# Patient Record
Sex: Male | Born: 1965 | Race: White | Hispanic: No | Marital: Married | Smoking: Never smoker
Health system: Southern US, Community
[De-identification: ages and names within clinical notes are randomized; demographics above are authoritative.]

## PROBLEM LIST (undated history)

## (undated) DIAGNOSIS — F419 Anxiety disorder, unspecified: Secondary | ICD-10-CM

## (undated) DIAGNOSIS — K219 Gastro-esophageal reflux disease without esophagitis: Secondary | ICD-10-CM

## (undated) DIAGNOSIS — I10 Essential (primary) hypertension: Secondary | ICD-10-CM

## (undated) DIAGNOSIS — F909 Attention-deficit hyperactivity disorder, unspecified type: Secondary | ICD-10-CM

## (undated) DIAGNOSIS — G4700 Insomnia, unspecified: Secondary | ICD-10-CM

## (undated) DIAGNOSIS — K5792 Diverticulitis of intestine, part unspecified, without perforation or abscess without bleeding: Secondary | ICD-10-CM

## (undated) HISTORY — PX: KNEE SURGERY: SHX244

## (undated) HISTORY — DX: Anxiety disorder, unspecified: F41.9

## (undated) HISTORY — DX: Essential (primary) hypertension: I10

## (undated) HISTORY — DX: Insomnia, unspecified: G47.00

## (undated) HISTORY — DX: Attention-deficit hyperactivity disorder, unspecified type: F90.9

---

## 2000-04-14 ENCOUNTER — Other Ambulatory Visit: Admission: RE | Admit: 2000-04-14 | Discharge: 2000-04-14 | Payer: Self-pay | Admitting: Urology

## 2000-04-14 ENCOUNTER — Encounter (INDEPENDENT_AMBULATORY_CARE_PROVIDER_SITE_OTHER): Payer: Self-pay | Admitting: Specialist

## 2005-01-20 ENCOUNTER — Ambulatory Visit (HOSPITAL_COMMUNITY): Admission: RE | Admit: 2005-01-20 | Discharge: 2005-01-20 | Payer: Self-pay | Admitting: Internal Medicine

## 2007-06-06 ENCOUNTER — Encounter: Admission: RE | Admit: 2007-06-06 | Discharge: 2007-06-06 | Payer: Self-pay | Admitting: Internal Medicine

## 2008-11-13 ENCOUNTER — Ambulatory Visit (HOSPITAL_COMMUNITY): Admission: RE | Admit: 2008-11-13 | Discharge: 2008-11-13 | Payer: Self-pay | Admitting: Internal Medicine

## 2012-08-23 ENCOUNTER — Emergency Department (HOSPITAL_COMMUNITY)
Admission: EM | Admit: 2012-08-23 | Discharge: 2012-08-23 | Disposition: A | Discharge status: Home / Self Care | Payer: 59 | Attending: Emergency Medicine | Admitting: Emergency Medicine

## 2012-08-23 ENCOUNTER — Emergency Department (HOSPITAL_COMMUNITY): Payer: 59

## 2012-08-23 ENCOUNTER — Encounter (HOSPITAL_COMMUNITY): Payer: Self-pay

## 2012-08-23 DIAGNOSIS — R109 Unspecified abdominal pain: Secondary | ICD-10-CM | POA: Insufficient documentation

## 2012-08-23 DIAGNOSIS — Z8719 Personal history of other diseases of the digestive system: Secondary | ICD-10-CM | POA: Insufficient documentation

## 2012-08-23 DIAGNOSIS — R112 Nausea with vomiting, unspecified: Secondary | ICD-10-CM | POA: Insufficient documentation

## 2012-08-23 HISTORY — DX: Diverticulitis of intestine, part unspecified, without perforation or abscess without bleeding: K57.92

## 2012-08-23 LAB — BASIC METABOLIC PANEL
CO2: 26 mEq/L (ref 19–32)
Calcium: 9.6 mg/dL (ref 8.4–10.5)
Chloride: 102 mEq/L (ref 96–112)
GFR calc Af Amer: >90 mL/min (ref >90)
GFR calc non Af Amer: 84 mL/min — ABNORMAL LOW (ref >90)
Glucose, Bld: 76 mg/dL (ref 70–99)
Potassium: 3.7 mEq/L (ref 3.5–5.1)
Sodium: 138 mEq/L (ref 135–145)

## 2012-08-23 LAB — URINALYSIS, ROUTINE W REFLEX MICROSCOPIC
Glucose, UA: NEGATIVE mg/dL
Hgb urine dipstick: NEGATIVE
Ketones, ur: NEGATIVE mg/dL
Leukocytes, UA: NEGATIVE
Nitrite: NEGATIVE
Protein, ur: NEGATIVE mg/dL
Specific Gravity, Urine: 1.01 (ref 1.005–1.030)
Urobilinogen, UA: 0.2 mg/dL (ref 0.0–1.0)
pH: 6.5 (ref 5.0–8.0)

## 2012-08-23 LAB — CBC WITH DIFFERENTIAL/PLATELET
Basophils Absolute: 0 10*3/uL (ref 0.0–0.1)
Basophils Relative: 0 % (ref 0–1)
Eosinophils Absolute: 0.1 10*3/uL (ref 0.0–0.7)
Eosinophils Relative: 1 % (ref 0–5)
HCT: 44.7 % (ref 39.0–52.0)
Lymphocytes Relative: 17 % (ref 12–46)
MCH: 30.5 pg (ref 26.0–34.0)
MCHC: 34.5 g/dL (ref 30.0–36.0)
Monocytes Absolute: 0.4 10*3/uL (ref 0.1–1.0)
Monocytes Relative: 7 % (ref 3–12)
Neutrophils Relative %: 75 % (ref 43–77)
Platelets: 247 10*3/uL (ref 150–400)
RBC: 5.05 MIL/uL (ref 4.22–5.81)
RDW: 12.1 % (ref 11.5–15.5)

## 2012-08-23 MED ORDER — IOHEXOL 300 MG/ML  SOLN
20.0000 mL | INTRAMUSCULAR | Status: AC
  Administered 2012-08-23: 25 mL via ORAL

## 2012-08-23 MED ORDER — IOHEXOL 300 MG/ML  SOLN
100.0000 mL | Freq: Once | INTRAMUSCULAR | Status: AC | PRN
  Administered 2012-08-23: 100 mL via INTRAVENOUS

## 2012-08-23 MED ORDER — OXYCODONE-ACETAMINOPHEN 5-325 MG PO TABS: 1.0000 | ORAL_TABLET | ORAL | Status: DC | PRN

## 2012-08-23 MED ORDER — METOCLOPRAMIDE HCL 10 MG PO TABS: 10.0000 mg | ORAL_TABLET | Freq: Four times a day (QID) | ORAL | Status: DC | PRN

## 2012-08-23 MED ORDER — HYDROMORPHONE HCL PF 1 MG/ML IJ SOLN
1.0000 mg | Freq: Once | INTRAMUSCULAR | Status: AC
  Administered 2012-08-23: 1 mg via INTRAVENOUS
  Filled 2012-08-23: qty 1

## 2012-08-23 MED ORDER — ONDANSETRON HCL 4 MG/2ML IJ SOLN
4.0000 mg | Freq: Once | INTRAMUSCULAR | Status: AC
  Administered 2012-08-23: 4 mg via INTRAVENOUS
  Filled 2012-08-23: qty 2

## 2012-08-23 MED ORDER — SODIUM CHLORIDE 0.9 % IV SOLN
Freq: Once | INTRAVENOUS | Status: AC
  Administered 2012-08-23: 04:00:00 via INTRAVENOUS

## 2012-08-23 NOTE — ED Notes (Signed)
PT. C/o nausea starting Thursday. States over the weekend felt sick to stomach and couldn't get out of bed. Went to [facility] who gave him medicine and scheduled for CT this AM at 1030. States has not had a BM since Sunday. Hx of diverticulitis.

## 2012-08-23 NOTE — ED Provider Notes (Signed)
History     CSN: [id number]  Arrival date & time 08/23/12  0302   First MD Initiated Contact with Patient 08/23/12 0314      Chief Complaint  Patient presents with  . Abdominal Pain    (Consider location/radiation/quality/duration/timing/severity/associated sxs/prior treatment) Patient is a 47 y.o. male presenting with abdominal pain. The history is provided by the patient.  Abdominal Pain Associated symptoms include abdominal pain.  He has been having abdominal pain for the last week. Pain started in the left lower quadrant and now has spread to the left upper quadrant and epigastric area. Pain waxes and wanes but it has not crampy. He has difficulty describing the pain. He states that it is sometimes sharp and sometimes still. He rates pain at 8/10. There is radiation of pain to the back. He has had nausea and vomiting. There's been no hematemesis. He has had fevers as high as 101. He denies chills or sweats. He has not had any diarrhea and states he has not had a bowel movement for the last several days. He does have a history of diverticulitis 8 days ago. He was seen at an urgent care center 4 days ago and started on ciprofloxacin and metronidazole but has not shown any improvement. He was out so given a prescription for ondansetron but he is still having nausea and vomiting in spite of that. He was scheduled for a CT scan later today, but was hurting too much to wait until then.  Past Medical History  Diagnosis Date  . Diverticulitis     Past Surgical History  Procedure Laterality Date  . Knee surgery Left     No family history on file.  History  Substance Use Topics  . Smoking status: Never Smoker   . Smokeless tobacco: Current User  . Alcohol Use: No      Review of Systems  Gastrointestinal: Positive for abdominal pain.  All other systems reviewed and are negative.    Allergies  Review of patient's allergies indicates no known allergies.  Home Medications   No current outpatient prescriptions on file.  BP 135/85  Pulse 77  Resp 18  SpO2 98%  Physical Exam  Nursing note and vitals reviewed.  47 year old male, resting comfortably and in no acute distress. Vital signs are normal. Oxygen saturation is 98%, which is normal. Head is normocephalic and atraumatic. PERRLA, EOMI. Oropharynx is clear. Neck is nontender and supple without adenopathy or JVD. Back is nontender and there is no CVA tenderness. Lungs are clear without rales, wheezes, or rhonchi. Chest is nontender. Heart has regular rate and rhythm without murmur. Abdomen is soft, flat, with mild tenderness in the left lower quadrant. There is no rebound or guarding. There are no masses or hepatosplenomegaly and peristalsis is normoactive. Extremities have no cyanosis or edema, full range of motion is present. Skin is warm and dry without rash. Neurologic: Mental status is normal, cranial nerves are intact, there are no motor or sensory deficits.   ED Course  Procedures (including critical care time)  Labs Reviewed - No data to display No results found.   1. Abdominal pain   2. Nausea & vomiting       MDM  Abdominal pain consistent with diverticulitis. However, failure to respond to appropriate medication as an outpatient is a concern. You'll be sent for CT scan of abdomen and pelvis. Old records are reviewed and he had a CT scan in 2006 which showed sigmoid diverticulitis.  CT  has come back showing no evidence of diverticulitis. Laboratory workup is unremarkable including a normal urinalysis. Liquids stool is noted in the colon consistent with gastroenteritis. Patient is advised of these findings. He is currently on antibiotics for presumed diverticulitis and he is advised that it should be safe to stop his antibiotics. Is adv ised to return should his symptoms worsen. He is discharged home with prescription for Percocet and for metoclopramide.      Dione Booze,  MD 08/23/12 [phone]

## 2012-08-23 NOTE — ED Notes (Addendum)
The patient is AOx4 and comfortable with the discharge instructions.  The patient's ride home is present.

## 2012-08-23 NOTE — ED Notes (Signed)
Advised the patient we need a urine sample.

## 2016-02-12 ENCOUNTER — Ambulatory Visit (HOSPITAL_COMMUNITY)
Admission: EM | Admit: 2016-02-12 | Discharge: 2016-02-12 | Disposition: A | Discharge status: Home / Self Care | Payer: 59 | Attending: Family Medicine | Admitting: Family Medicine

## 2016-02-12 ENCOUNTER — Encounter (HOSPITAL_COMMUNITY): Payer: Self-pay | Admitting: *Deleted

## 2016-02-12 DIAGNOSIS — M25561 Pain in right knee: Secondary | ICD-10-CM | POA: Diagnosis not present

## 2016-02-12 MED ORDER — METHYLPREDNISOLONE ACETATE 80 MG/ML IJ SUSP
INTRAMUSCULAR | Status: AC
  Filled 2016-02-12: qty 1

## 2016-02-12 MED ORDER — BUPIVACAINE HCL (PF) 0.5 % IJ SOLN
INTRAMUSCULAR | Status: AC
  Filled 2016-02-12: qty 10

## 2016-02-12 MED ORDER — LIDOCAINE HCL 2 % IJ SOLN
INTRAMUSCULAR | Status: AC
  Filled 2016-02-12: qty 20

## 2016-02-12 NOTE — ED Provider Notes (Signed)
CSN: [id number]     Arrival date & time 02/12/16  1558 History   First MD Initiated Contact with Patient 02/12/16 1621     Chief Complaint  Patient presents with  . Knee Pain   (Consider location/radiation/quality/duration/timing/severity/associated sxs/prior Treatment) Patient has been having right knee pain for last few days and is requesting cortisone shot.   The history is provided by the patient.  Knee Pain  Location:  Knee Time since incident:  2 days Knee location:  R knee Pain details:    Quality:  Aching   Radiates to:  Does not radiate   Severity:  Moderate   Onset quality:  Sudden   Duration:  2 days   Timing:  Constant   Progression:  Waxing and waning Chronicity:  New Dislocation: no   Foreign body present:  No foreign bodies Tetanus status:  Unknown Prior injury to area:  No Relieved by:  Nothing Worsened by:  Activity Ineffective treatments:  NSAIDs Associated symptoms: stiffness     Past Medical History:  Diagnosis Date  . Diverticulitis    Past Surgical History:  Procedure Laterality Date  . KNEE SURGERY Left    History reviewed. No pertinent family history. Social History  Substance Use Topics  . Smoking status: Never Smoker  . Smokeless tobacco: Current User  . Alcohol use No    Review of Systems  Constitutional: Negative.   HENT: Negative.   Eyes: Negative.   Respiratory: Negative.   Cardiovascular: Negative.   Gastrointestinal: Negative.   Endocrine: Negative.   Genitourinary: Negative.   Musculoskeletal: Positive for arthralgias and stiffness.  Skin: Negative.   Allergic/Immunologic: Negative.   Neurological: Negative.   Hematological: Negative.   Psychiatric/Behavioral: Negative.     Allergies  Patient has no known allergies.  Home Medications   Prior to Admission medications   Medication Sig Start Date End Date Taking? Authorizing Provider  amphetamine-dextroamphetamine (ADDERALL XR) 20 MG 24 hr capsule Take 20 mg by  mouth every morning.    Historical Provider, MD  ciprofloxacin (CIPRO) 500 MG tablet Take 500 mg by mouth 2 (two) times daily.    Historical Provider, MD  metoCLOPramide (REGLAN) 10 MG tablet Take 1 tablet (10 mg total) by mouth every 6 (six) hours as needed (Nausea). 08/23/12   Dione Boozeavid Glick, MD  metroNIDAZOLE (FLAGYL) 500 MG tablet Take 500 mg by mouth 3 (three) times daily.    Historical Provider, MD  ondansetron (ZOFRAN-ODT) 4 MG disintegrating tablet Take 4 mg by mouth every 8 (eight) hours as needed for nausea.    Historical Provider, MD  oxyCODONE-acetaminophen (PERCOCET/ROXICET) 5-325 MG per tablet Take 1 tablet by mouth every 4 (four) hours as needed for pain. 08/23/12   Dione Boozeavid Glick, MD  triazolam (HALCION) 0.25 MG tablet Take 0.25 mg by mouth at bedtime as needed (sleep).    Historical Provider, MD   Meds Ordered and Administered this Visit  Medications - No data to display  BP (!) 153/105 (BP Location: Right Arm)   Pulse 82   Temp 98.6 F (37 C) (Oral)   Resp 16   SpO2 99%  No data found.   Physical Exam  Urgent Care Course   Clinical Course     .Joint Aspiration/Arthrocentesis Date/Time: 02/12/2016 4:38 PM Performed by: Deatra CanterXFORD, WILLIAM J Authorized by: Bradd CanaryKINDL, JAMES D   Consent:    Consent obtained:  Verbal   Risks discussed:  Bleeding   Alternatives discussed:  No treatment Location:    Location:  Knee Anesthesia (see MAR for exact dosages):    Anesthesia method:  Local infiltration   Local anesthetic:  Lidocaine 2% w/o epi and bupivacaine 0.25% w/o epi Procedure details:    Needle gauge:  22 G   Ultrasound guidance: no     Approach:  Medial   Aspirate amount:  0   Steroid injected: yes     Specimen collected: no   Post-procedure details:    Dressing:  Adhesive bandage   Patient tolerance of procedure:  Tolerated well, no immediate complications Comments:     80mg  depomedrol  2 cc's bupivicaine 2 cc's of 2% lidocaine Injected into right knee.    (including critical care time)  Labs Review Labs Reviewed - No data to display  Imaging Review No results found.   Visual Acuity Review  Right Eye Distance:   Left Eye Distance:   Bilateral Distance:    Right Eye Near:   Left Eye Near:    Bilateral Near:         MDM   1. Acute pain of right knee    Right knee injection with 80mg  kenalog 2 cc's bupivicaine and 2 cc's of 2% lidocaine     Deatra CanterWilliam J Oxford, FNP 02/12/16 1641

## 2016-02-12 NOTE — ED Triage Notes (Signed)
R  Knee  Pain      Started  Last  Pm       No   specefic  Injury     Pt  Requesting  A  Cortisone  Shot        Problems  With  l knee  In  Past

## 2018-11-07 ENCOUNTER — Ambulatory Visit: Payer: Self-pay

## 2018-11-23 ENCOUNTER — Other Ambulatory Visit: Payer: Self-pay

## 2018-11-23 NOTE — Progress Notes (Signed)
Pre-employment urine drug screen collected using [employer] Chain of Custody forms for [place] Account # [id number]. Specimen ID # [id number]

## 2018-12-04 ENCOUNTER — Telehealth: Payer: Self-pay | Admitting: Internal Medicine

## 2018-12-04 NOTE — Telephone Encounter (Signed)
No--thanks--but I needed info for drug screen.

## 2018-12-04 NOTE — Telephone Encounter (Signed)
Please review. Is he your patient?

## 2018-12-04 NOTE — Telephone Encounter (Signed)
Pt calling to let Dr. Rosanna Randy know the medications he is currently taking.  amphetamine-dextroamphetamine (ADDERALL XR) 20 MG 24 hr capsule triazolam (HALCION) 0.25 MG tablet Irbesartan 150 mg  Pt uses:  [facility]  Address: [address] [place], [place] [address] Phone: [phone]  Thanks, [facility]

## 2018-12-14 ENCOUNTER — Ambulatory Visit: Payer: Self-pay

## 2018-12-14 DIAGNOSIS — Z23 Encounter for immunization: Secondary | ICD-10-CM

## 2019-01-23 ENCOUNTER — Other Ambulatory Visit: Payer: Self-pay

## 2019-01-23 DIAGNOSIS — Z20822 Contact with and (suspected) exposure to covid-19: Secondary | ICD-10-CM

## 2019-01-25 LAB — NOVEL CORONAVIRUS, NAA: SARS-CoV-2, NAA: NOT DETECTED

## 2019-03-22 DIAGNOSIS — E291 Testicular hypofunction: Secondary | ICD-10-CM | POA: Diagnosis not present

## 2019-03-22 DIAGNOSIS — R69 Illness, unspecified: Secondary | ICD-10-CM | POA: Diagnosis not present

## 2019-03-22 DIAGNOSIS — I10 Essential (primary) hypertension: Secondary | ICD-10-CM | POA: Diagnosis not present

## 2019-05-16 ENCOUNTER — Ambulatory Visit: Payer: 59 | Attending: Family

## 2019-05-16 DIAGNOSIS — Z23 Encounter for immunization: Secondary | ICD-10-CM

## 2019-05-16 NOTE — Progress Notes (Signed)
   Covid-19 Vaccination Clinic  Name:  Logan Bruce    MRN: [id number] DOB: [birth year]  05/16/2019  Mr. Bee was observed post Covid-19 immunization for 15 minutes without incident. He was provided with Vaccine Information Sheet and instruction to access the V-Safe system.   [name] was instructed to call 911 with any severe reactions post vaccine: Marland Kitchen Difficulty breathing  . Swelling of face and throat  . A fast heartbeat  . A bad rash all over body  . Dizziness and weakness   Immunizations Administered    Name Date Dose VIS Date Route   Moderna COVID-19 Vaccine 05/16/2019 10:44 AM 0.5 mL 02/12/2019 Intramuscular   Manufacturer: Moderna   Lot: [id number]   NDC: 38177-116-57

## 2019-05-28 ENCOUNTER — Other Ambulatory Visit: Payer: Self-pay

## 2019-05-28 ENCOUNTER — Ambulatory Visit: Payer: Self-pay | Admitting: Occupational Medicine

## 2019-05-28 VITALS — BP 143/106 | HR 84 | Temp 98.5°F | Wt 161.8 lb

## 2019-05-28 DIAGNOSIS — M79645 Pain in left finger(s): Secondary | ICD-10-CM

## 2019-05-28 MED ORDER — TRAMADOL HCL 50 MG PO TABS: 50.0000 mg | ORAL_TABLET | Freq: Three times a day (TID) | ORAL | 0 refills | Status: AC | PRN

## 2019-05-28 NOTE — Progress Notes (Signed)
   Subjective:    Patient ID: Logan Bruce, male    DOB: 1965-10-19, 54 y.o.   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  He presents to our onsite employee health clinic with a complaint of left thumb pain for the last month.  There was no injury accident or trauma history.  He has been noting discomfort in his left MCP joint for about a month now.  He notes swelling in same area.  Worse with gripping.  No reports of skin redness.  No prior history of gout.  No other joints affected.  He has been taking ibuprofen with some relief.  Last night he said it was so intense that he could not sleep until 3:00 in the morning.  Pain finally subsided.  No inciting trauma or event.    Review of Systems     Objective:   Physical Exam  Pleasant gentleman in no distress.  Left thumb full range of motion.  Swelling and tenderness noted at the left thumb MCP joint.  No redness.  No rash.  No open skin.  I could not appreciate any palpable masses over the dorsal thumb.      Assessment & Plan:  Subacute left thumb pain-with the chronicity of this problem I think it is wise to send him over to orthopedics.  In the meantime I think that some tramadol at bedtime would be helpful for pain management.

## 2019-06-07 NOTE — Progress Notes (Signed)
Patient ID: Logan Bruce, male    DOB: 1965/09/04, 54 y.o.   MRN: [id number]  PCP: Jamelle Haring, MD  Chief Complaint  Patient presents with  . New Patient (Initial Visit)  . Establish Care  . Hypertension    Subjective:   Logan Bruce is a 54 y.o. male, presents to clinic with CC of the following:  Chief Complaint  Patient presents with  . New Patient (Initial Visit)  . Establish Care  . Hypertension    HPI:  Patient is a 54 year old male who works for the city of [place] who presents today new to the practice. He just had a complete physical with his doctor who is retiring, and Dr. Chilton Si in January.  All of his records are on paper charts, and he had that paper chart with him today to review.  HTN hx Reviewed medication regimen. Is taking BP med, irbesartan 150 mg daily.  He was told to increase the 300 mg by his doctor when he last saw in January due to concerns for his blood pressure, although he has not yet done that, as he was concerned he may run out of his medicine.  Has checked BP's at home and running in the 160/110 range on home checks. Weight has remained stable, may be increased some in recent past  No CP, SOB, marked fatigue, increased HA's, vision changes, LE swelling   Diet  - trying to eat healthy (DASH diet, low sodium intake discussed) to help keep weight controlled, a lot of salads Physical activity level - no reg exercise, walks during day  ADHD  Noted he has been on a stimulant medicine like Adderall since almost elementary school.  It helps him stay focused, especially with a job like he has presently.  He continues to take it daily.  Denies any side effects from this medicine.  No palpitations, chest pains, shortness of breath, heart racing.  He also notes that his wife is on Vyvanse, and his kids are on stimulant medications. He had evaluations when he was really young in school, although has had no real evaluations done more  recently.  He does feel he needs this for his occupation. He has been off of this now for the past 2 to 3 weeks  GERD; He takes omeprazole daily, and states he has to to be able to eat spaghetti, pizza, red sauces.  He has been on this for years.  It is another medicine he states he really cannot do without.  Insomnia: He has been taking triazolam at bedtime to help him sleep for years.  Recently, that was increased to 1-1/2 tablets from 1.  He states he has tried multiple other entities over time and spent a lot of money and co-pays for medicines that just made him groggy during the day, did not work, and this 1 works great for him.  He states if he does not have it, he cannot sleep for more than 15 minutes, and that he just simply cannot work.  Thumb Pain - He was last seen at the [facility] [place] clinic in early March with left thumb pain, and referred to orthopedics.  He noted he was coming here, and did not go to orthopedics.  He feels pain at the base of his left thumb, more in the need of the muscle area, and also behind the dorsal surface of the left thumb towards the wrist joint.  He notes he works on  his laptop in his car, and often rests his hand right on the area that is uncomfortable.  He denied any trauma before this came on.  It has been about a month now, and is a little better now than it was when he saw the clinician in the city clinic.  He was given tramadol to take for pain, mostly at nighttime, and has not used it that much recent past.  He is right-handed.  No numbness or tingling in the thumb.  His past medical history is significant for diverticulitis, Also had left knee surgery, And more recently had a right knee injection at an urgent care in December 2017.  He noted he was told he likely needed a tetanus shot, although looking through the chart he brought, his last tetanus was in 2015.  (Tdap)  He was also recommended to get a colonoscopy by his previous doctor, and was  inquiring about that.    Current Outpatient Medications:  .  amphetamine-dextroamphetamine (ADDERALL XR) 20 MG 24 hr capsule, Take 20 mg by mouth every morning., Disp: , Rfl:  .  irbesartan (AVAPRO) 300 MG tablet, Take 300 mg by mouth daily., Disp: , Rfl:  .  omeprazole (PRILOSEC) 20 MG capsule, Take 20 mg by mouth daily., Disp: , Rfl:  .  traMADol (ULTRAM) 50 MG tablet, Take by mouth every 6 (six) hours as needed., Disp: , Rfl:  .  triazolam (HALCION) 0.25 MG tablet, Take 0.25 mg by mouth at bedtime as needed (sleep)., Disp: , Rfl:    No Known Allergies   Past Surgical History:  Procedure Laterality Date  . KNEE SURGERY Left    x3 reconstruction-[facility]     Family History  Problem Relation Age of Onset  . Cancer Father      Social History   Tobacco Use  . Smoking status: Never Smoker  . Smokeless tobacco: Current User  Substance Use Topics  . Alcohol use: No    With staff assistance, above reviewed with the patient today.  ROS: As per HPI, otherwise no specific complaints on a limited and focused system review   No results found for this or any previous visit (from the past 72 hour(s)).   PHQ2/9: Depression screen [facility] 2/9 06/10/2019  Decreased Interest 0  Down, Depressed, Hopeless 0  PHQ - 2 Score 0  Altered sleeping 0  Tired, decreased energy 0  Change in appetite 0  Feeling bad or failure about yourself  0  Trouble concentrating 0  Moving slowly or fidgety/restless 0  Suicidal thoughts 0  PHQ-9 Score 0  Difficult doing work/chores Not difficult at all   PHQ-2/9 Result is neg Fall Risk: Fall Risk  06/10/2019  Falls in the past year? 0  Number falls in past yr: 0  Injury with Fall? 0      Objective:   Vitals:   06/10/19 1357  BP: (!) 140/100  Pulse: 82  Resp: 16  Temp: 97.7 F (36.5 C)  TempSrc: Temporal  SpO2: 98%  Weight: 158 lb 14.4 oz (72.1 kg)  Height: 5\' 7"  (1.702 m)    Body mass index is 24.89  kg/m.  Physical Exam   NAD, masked HEENT - [place]/AT, sclera anicteric, PERRL, EOMI, conj - non-inj'ed, TM's and canals clear, pharynx clear Neck - supple, no adenopathy, no TM, carotids 2+ and = without bruits bilat Car - RRR without m/g/r Pulm- RR and effort normal at rest, CTA without wheeze or rales Abd -  soft, NT diffusely, no focal epigastric discomfort  Back - no CVA tenderness Skin- no rash noted on exposed areas Ext - no LE edema,  L hand -he had good range of motion of his left thumb, no marked pain with motion testing, good pinch strength pinching his thumb to his fifth digit resisting a pull-through, not markedly tender to palpate at the base of the thumb over the meet of the muscle region nor on the dorsal aspect along the metacarpal region and proximal, where he feels his discomfort at times, no focal tenderness at the snuffbox, no marked wrist discomfort with palpation, no swelling, Finkelstein's test was mildly positive, sensation intact to light touch over the hand and thumb area. Neuro/psychiatric - affect was not flat, appropriate with conversation  Alert and oriented  Grossly non-focal - good strength on testing extremities, sensation intact to LT in distal extremities, Romberg was negative, no pronator drift, good finger-to-nose, normal gait with good tandem walk, DTRs 2+ on the right patella, 1+ left patella (where he has had prior surgeries, and noted that was not a new finding)  Speech  normal   Results for orders placed or performed in visit on 01/23/19  Novel Coronavirus, NAA ([facility])   Specimen: Nasopharyngeal(NP) swabs in vial transport medium   NASOPHARYNGE  TESTING  Result Value Ref Range   SARS-CoV-2, NAA Not Detected Not Detected   Labs 03/25/19 - TC - 217, LDL - 121, HDL 71 Glc - 101, Comp panel normal otherwise, CBC - normal PSA - 3.1 Hep C ab - NR      Assessment & Plan:   1. Essential hypertension We will increase the irbesartan to 300 mg daily  from 150, and noted to him I was not too optimistic that that will be enough to get his blood pressure at goal, although he was hesitant to try to add a second medicine at this point, and will try the increased dose briefly.  He is to check his blood pressures at home after taking the increased dose for 4 to 5 days, and write them down and bring with with him on follow-up Likely will need a second medicine for better control although await his response. Echo at length my concerns with him being on Adderall with his blood pressure this high, although he was convinced that the Adderall was in no way causative of his blood pressure elevation, and is a medicine he needs for his ability to work. - irbesartan (AVAPRO) 300 MG tablet; Take 1 tablet (300 mg total) by mouth daily.  Dispense: 90 tablet; Refill: 1  2. Attention deficit hyperactivity disorder (ADHD), unspecified ADHD type As above, concerned with the stimulant medicine, and all of the years he has been taking 1.  Noted this to him today.  Noted it is a very controlled substance, and when I can put a refill on.  He will follow-up before he is in need of another refill, and do want to get his blood pressure better controlled. - amphetamine-dextroamphetamine (ADDERALL XR) 20 MG 24 hr capsule; Take 1 capsule (20 mg total) by mouth every morning.  Dispense: 30 capsule; Refill: 0  3. Other insomnia Discussed at length my concerns with routine use of triazolam, a benzodiazepine for sleep.  He has been on this for years, and very adamant that he has tried a lot of other entities which have not been helpful.  He is in no way wants to try any other entities at this point, and states  he just simply cannot do without this especially with respect to his job.  At this point, felt the best approach was to refill it presently.  Did discuss my concerns with this medicine including tachyphylaxis and dependence issues. - triazolam (HALCION) 0.25 MG tablet; Take 1 tablet  (0.25 mg total) by mouth at bedtime as needed (sleep; Take 1.5 by mouth daily).  Dispense: 45 tablet; Refill: 5  4. Gastroesophageal reflux disease without esophagitis Discussed trying days away from this medicine, and some of the concerns with being on this long-term better servicing. - omeprazole (PRILOSEC) 20 MG capsule; Take 1 capsule (20 mg total) by mouth daily.  Dispense: 30 capsule; Refill: 5  5. Screening for colorectal cancer Agreed with his previous doctor that a screening colonoscopy would be best right now and ordered.  He then noted late in the visit about Cologuard as an option, and I did note that this was an option, although I still believe the gold standard is the colonoscopy.  If the Cologuard does return positive, he would proceed to a colonoscopy.  Inquired about time frames, and informed him if his colonoscopy was negative, he would not need a repeat for 10 years.  They agree with wanting to proceed with the colonoscopy when discussed these 2 options. - Ambulatory referral to Gastroenterology  6. Tendinitis of thumb Educated, likely feel there may be a compression component with him having poor ergonomics with his laptop use in his car contributing, namely constant compression on the area that is tender of the left base of the hand.  Recommended a pad or avoiding compression is best.  Also noted a possible element of a de Quervain's like tenosynovitis, and recommended ice, especially at the end of the day, trying to avoid any tramadol use at this point, and if more problematic, getting an over-the-counter spica splint to apply to wear for 1 to 2 weeks and assess his response. Discussed how an x-ray is probably not going to be helpful presently, although symptoms are not improving, likely will be ordered.  7. Mixed hyperlipidemia Reviewed his cholesterol results with him, which his previous doctor noted were all good.  Noted the LDL cholesterol was higher than desired, and the  total cholesterol slightly elevated, although that his profile was not that bad. We will repeat this fasting on his follow-up visit to help reassess.  8. History of prediabetes His blood sugar was just slightly over the 100 desired range, and it was not fasting.  We will recheck this on his follow-up visit when he comes fasting.  We will follow-up in about 4 weeks time, sooner as needed,     Jamelle Haring, MD 06/10/19 2:27 PM

## 2019-06-10 ENCOUNTER — Other Ambulatory Visit: Payer: Self-pay

## 2019-06-10 ENCOUNTER — Encounter: Payer: Self-pay | Admitting: Internal Medicine

## 2019-06-10 ENCOUNTER — Ambulatory Visit (INDEPENDENT_AMBULATORY_CARE_PROVIDER_SITE_OTHER): Payer: 59 | Admitting: Internal Medicine

## 2019-06-10 VITALS — BP 140/100 | HR 82 | Temp 97.7°F | Resp 16 | Ht 67.0 in | Wt 158.9 lb

## 2019-06-10 DIAGNOSIS — Z0000 Encounter for general adult medical examination without abnormal findings: Secondary | ICD-10-CM | POA: Insufficient documentation

## 2019-06-10 DIAGNOSIS — F909 Attention-deficit hyperactivity disorder, unspecified type: Secondary | ICD-10-CM

## 2019-06-10 DIAGNOSIS — R69 Illness, unspecified: Secondary | ICD-10-CM | POA: Diagnosis not present

## 2019-06-10 DIAGNOSIS — K219 Gastro-esophageal reflux disease without esophagitis: Secondary | ICD-10-CM

## 2019-06-10 DIAGNOSIS — Z87898 Personal history of other specified conditions: Secondary | ICD-10-CM | POA: Insufficient documentation

## 2019-06-10 DIAGNOSIS — E782 Mixed hyperlipidemia: Secondary | ICD-10-CM | POA: Diagnosis not present

## 2019-06-10 DIAGNOSIS — M778 Other enthesopathies, not elsewhere classified: Secondary | ICD-10-CM | POA: Diagnosis not present

## 2019-06-10 DIAGNOSIS — Z1211 Encounter for screening for malignant neoplasm of colon: Secondary | ICD-10-CM

## 2019-06-10 DIAGNOSIS — I10 Essential (primary) hypertension: Secondary | ICD-10-CM

## 2019-06-10 DIAGNOSIS — Z1212 Encounter for screening for malignant neoplasm of rectum: Secondary | ICD-10-CM | POA: Diagnosis not present

## 2019-06-10 DIAGNOSIS — G4709 Other insomnia: Secondary | ICD-10-CM

## 2019-06-10 MED ORDER — OMEPRAZOLE 20 MG PO CPDR: 20.0000 mg | DELAYED_RELEASE_CAPSULE | Freq: Every day | ORAL | 5 refills | Status: DC

## 2019-06-10 MED ORDER — AMPHETAMINE-DEXTROAMPHET ER 20 MG PO CP24: 20.0000 mg | ORAL_CAPSULE | ORAL | 0 refills | Status: DC

## 2019-06-10 MED ORDER — TRIAZOLAM 0.25 MG PO TABS: 0.2500 mg | ORAL_TABLET | Freq: Every evening | ORAL | 5 refills | Status: DC | PRN

## 2019-06-10 MED ORDER — IRBESARTAN 300 MG PO TABS: 300.0000 mg | ORAL_TABLET | Freq: Every day | ORAL | 1 refills | Status: DC

## 2019-06-10 NOTE — Patient Instructions (Addendum)
Referral for colonoscopy provided.  Follow-up in about 4 weeks, please come fasting for that appointment   Hypertension, Adult Hypertension is another name for high blood pressure. High blood pressure forces your heart to work harder to pump blood. This can cause problems over time. There are two numbers in a blood pressure reading. There is a top number (systolic) over a bottom number (diastolic). It is best to have a blood pressure that is below 120/80. Healthy choices can help lower your blood pressure, or you may need medicine to help lower it. What are the causes? The cause of this condition is not known. Some conditions may be related to high blood pressure. What increases the risk?  Smoking.  Having type 2 diabetes mellitus, high cholesterol, or both.  Not getting enough exercise or physical activity.  Being overweight.  Having too much fat, sugar, calories, or salt (sodium) in your diet.  Drinking too much alcohol.  Having long-term (chronic) kidney disease.  Having a family history of high blood pressure.  Age. Risk increases with age.  Race. You may be at higher risk if you are African American.  Gender. Men are at higher risk than women before age 70. After age 27, women are at higher risk than men.  Having obstructive sleep apnea.  Stress. What are the signs or symptoms?  High blood pressure may not cause symptoms. Very high blood pressure (hypertensive crisis) may cause: ? Headache. ? Feelings of worry or nervousness (anxiety). ? Shortness of breath. ? Nosebleed. ? A feeling of being sick to your stomach (nausea). ? Throwing up (vomiting). ? Changes in how you see. ? Very bad chest pain. ? Seizures. How is this treated?  This condition is treated by making healthy lifestyle changes, such as: ? Eating healthy foods. ? Exercising more. ? Drinking less alcohol.  Your health care provider may prescribe medicine if lifestyle changes are not enough to  get your blood pressure under control, and if: ? Your top number is above 130. ? Your bottom number is above 80.  Your personal target blood pressure may vary. Follow these instructions at home: Eating and drinking   If told, follow the DASH eating plan. To follow this plan: ? Fill one half of your plate at each meal with fruits and vegetables. ? Fill one fourth of your plate at each meal with whole grains. Whole grains include whole-wheat pasta, brown rice, and whole-grain bread. ? Eat or drink low-fat dairy products, such as skim milk or low-fat yogurt. ? Fill one fourth of your plate at each meal with low-fat (lean) proteins. Low-fat proteins include fish, chicken without skin, eggs, beans, and tofu. ? Avoid fatty meat, cured and processed meat, or chicken with skin. ? Avoid pre-made or processed food.  Eat less than 1,500 mg of salt each day.  Do not drink alcohol if: ? Your doctor tells you not to drink. ? You are pregnant, may be pregnant, or are planning to become pregnant.  If you drink alcohol: ? Limit how much you use to:  0-1 drink a day for women.  0-2 drinks a day for men. ? Be aware of how much alcohol is in your drink. In the [place] one drink equals one 12 oz bottle of beer (355 mL), one 5 oz glass of wine (148 mL), or one 1 oz glass of hard liquor (44 mL). Lifestyle   Work with your doctor to stay at a healthy weight or to lose weight. Ask  your doctor what the best weight is for you.  Get at least 30 minutes of exercise most days of the week. This may include walking, swimming, or biking.  Get at least 30 minutes of exercise that strengthens your muscles (resistance exercise) at least 3 days a week. This may include lifting weights or doing Pilates.  Do not use any products that contain nicotine or tobacco, such as cigarettes, e-cigarettes, and chewing tobacco. If you need help quitting, ask your doctor.  Check your blood pressure at home as told by your  doctor.  Keep all follow-up visits as told by your doctor. This is important. Medicines  Take over-the-counter and prescription medicines only as told by your doctor. Follow directions carefully.  Do not skip doses of blood pressure medicine. The medicine does not work as well if you skip doses. Skipping doses also puts you at risk for problems.  Ask your doctor about side effects or reactions to medicines that you should watch for. Contact a doctor if you:  Think you are having a reaction to the medicine you are taking.  Have headaches that keep coming back (recurring).  Feel dizzy.  Have swelling in your ankles.  Have trouble with your vision. Get help right away if you:  Get a very bad headache.  Start to feel mixed up (confused).  Feel weak or numb.  Feel faint.  Have very bad pain in your: ? Chest. ? Belly (abdomen).  Throw up more than once.  Have trouble breathing. Summary  Hypertension is another name for high blood pressure.  High blood pressure forces your heart to work harder to pump blood.  For most people, a normal blood pressure is less than 120/80.  Making healthy choices can help lower blood pressure. If your blood pressure does not get lower with healthy choices, you may need to take medicine. This information is not intended to replace advice given to you by your health care provider. Make sure you discuss any questions you have with your health care provider. Document Revised: 11/08/2017 Document Reviewed: 11/08/2017 Elsevier Patient Education  2020 [employer].

## 2019-06-14 ENCOUNTER — Ambulatory Visit: Payer: Self-pay | Admitting: Internal Medicine

## 2019-06-17 ENCOUNTER — Ambulatory Visit: Payer: Self-pay

## 2019-06-17 ENCOUNTER — Other Ambulatory Visit: Payer: Self-pay

## 2019-06-17 VITALS — BP 147/99 | HR 70 | Resp 12

## 2019-06-17 DIAGNOSIS — Z0130 Encounter for examination of blood pressure without abnormal findings: Secondary | ICD-10-CM

## 2019-06-17 NOTE — Progress Notes (Signed)
Present requesting BP check.  States has had a headache the past 2-3 hours.  PCP is [facility].  States he takes BP medicine & PCP increased the dose last week.  Offered tylenol or ibuprofen for headache - states I'll just go home & take something.  AMD

## 2019-06-18 ENCOUNTER — Ambulatory Visit: Payer: 59 | Attending: Family

## 2019-06-18 DIAGNOSIS — Z23 Encounter for immunization: Secondary | ICD-10-CM

## 2019-06-18 NOTE — Progress Notes (Signed)
   Covid-19 Vaccination Clinic  Name:  JAYLEN KNOPE    MRN: [id number] DOB: 09/18/1965  06/18/2019  Mr. Pelzer was observed post Covid-19 immunization for 15 minutes without incident. He was provided with Vaccine Information Sheet and instruction to access the V-Safe system.   Mr. Shiffler was instructed to call 911 with any severe reactions post vaccine: Marland Kitchen Difficulty breathing  . Swelling of face and throat  . A fast heartbeat  . A bad rash all over body  . Dizziness and weakness   Immunizations Administered    Name Date Dose VIS Date Route   Moderna COVID-19 Vaccine 06/18/2019 12:56 PM 0.5 mL 02/12/2019 Intramuscular   Manufacturer: Moderna   Lot: [id number]   NDC: 74734-037-09

## 2019-06-20 ENCOUNTER — Encounter: Payer: Self-pay | Admitting: *Deleted

## 2019-06-21 NOTE — Telephone Encounter (Signed)
-----   Message from Jamelle Haring, MD sent at 06/20/2019  2:57 PM EDT ----- Regarding: follow up Can we contact him or send a reminder to him. Thanks. ----- Message ----- From: Christell Faith Sent: 06/20/2019  11:13 AM EDT To: Jamelle Haring, MD

## 2019-06-21 NOTE — Telephone Encounter (Signed)
Called and lvm informing patient to contact [facility] to schedule his screening colonoscopy.  I also sent patient a message via mychart.

## 2019-06-24 ENCOUNTER — Telehealth: Payer: Self-pay

## 2019-06-24 NOTE — Telephone Encounter (Signed)
Patient left voicemail. Wants to have colon screening. Referral closed/due to no contact. Please re-open, I don't have security to do so.

## 2019-06-25 NOTE — Telephone Encounter (Signed)
Hey Dejuanna-referral has been opened.  Thanks,  [place], [place]

## 2019-07-02 NOTE — Progress Notes (Signed)
Patient ID: [name], male    DOB: 1965-07-06, 54 y.o.   MRN: [id number]  PCP: Towanda Malkin, MD  Chief Complaint  Patient presents with  . Follow-up  . ADHD  . Gastroesophageal Reflux  . Insomnia  . Hypertension    Subjective:   Logan Bruce is a 54 y.o. male, presents to clinic with CC of the following:  Chief Complaint  Patient presents with  . Follow-up  . ADHD  . Gastroesophageal Reflux  . Insomnia  . Hypertension    HPI:  Patient is a 54 year old male who I first met about a month ago. He had been followed by Dr. Nyoka Cowden prior. We discussed having him return fasting for this visit to check some follow-up lab tests  HTNhx He did present on 06/17/2019 to the [facility] [place] clinic as he was having headaches to get his blood pressure checked.  It was 147/99. Reviewed medication regimen - irbesartan,  was increased to 300 mg daily from 150 mg daily last visit, although I noted that may not be enough to get better control of his blood pressure and discussed the second medicine addition.  He very much wanted to try the increase before adding a second medicine.  I did note on my last note it was likely would need a second medicine for better blood pressure control.  BP Readings from Last 3 Encounters:  07/03/19 (!) 140/98  06/17/19 (!) 147/99  06/10/19 (!) 140/100   Has checked BP's at home and the lower number has been running in the 90-100 range.  It is not sure of what the top number has been running when asked, as he mainly pays attention to the bottom number.  He has been getting about 2-3 blood pressure checks with me, in the p.m. He denies persistent headaches, had one that prompted his visit to the city clinic above for a blood pressure check, and occur maybe once or twice a week at most.  Denies any other concerning symptoms. No CP, SOB, marked fatigue,increased HA's, vision changes, LE swelling  Weight has remained stable Wt Readings  from Last 3 Encounters:  07/03/19 158 lb (71.7 kg)  06/10/19 158 lb 14.4 oz (72.1 kg)  05/28/19 161 lb 12.8 oz (73.4 kg)    ADHD  Noted he has been on a stimulant medicine like Adderall since almost elementary school.  It helps him stay focused, especially with a job like he has presently.  He continues to take it daily.   Denies any side effects from this medicine.  No palpitations, chest pains, shortness of breath, heart racing. I did ask if he ever has days where he does not take it, like on the weekend, and he states that can happen.  Strongly encouraged days away when possible today, as well as use of this medicine will be helpful with respect to his blood pressure control and his cardiac health in general. To review - He had evaluations when he was really young in school, although has had no real evaluations done more recently.  He does feel he needs this for his occupation. I did discuss concerns with the stimulant medicine and the length of time he has been taking it previously.  GERD; He continues to take omeprazole daily, and denied any reflux symptoms recently. He has been on this for years.  It is another medicine he states he really cannot do without.  Insomnia: On triazolam qhs  To review -  He has been taking triazolam at bedtime to help him sleep for years.  Recently, that was increased to 1-1/2 tablets from 1.  He states he has tried multiple other entities over time and spent a lot of money and co-pays for medicines that just made him groggy during the day, did not work, and this 1 works great for him.  He states if he does not have it, he cannot sleep for more than 15 minutes, and that he just simply cannot work.   I discussed at length my concerns with routine use of triazolam previously, a benzodiazepine for sleep.  He has been on this for years, and very adamant that he has tried a lot of other entities which have not been helpful.  He is in no way wants to try any other  entities at this point, and states he just simply cannot do without this especially with respect to his job. Did discuss my concerns with this medicine including tachyphylaxis and dependence issues.   Patient Active Problem List   Diagnosis Date Noted  . Essential hypertension 06/10/2019  . Attention deficit hyperactivity disorder (ADHD) 06/10/2019  . Other insomnia 06/10/2019  . Gastroesophageal reflux disease without esophagitis 06/10/2019  . Tendinitis of thumb 06/10/2019  . Mixed hyperlipidemia 06/10/2019  . History of prediabetes 06/10/2019      Current Outpatient Medications:  .  amphetamine-dextroamphetamine (ADDERALL XR) 20 MG 24 hr capsule, Take 1 capsule (20 mg total) by mouth every morning., Disp: 30 capsule, Rfl: 0 .  irbesartan (AVAPRO) 300 MG tablet, Take 1 tablet (300 mg total) by mouth daily., Disp: 90 tablet, Rfl: 1 .  omeprazole (PRILOSEC) 20 MG capsule, Take 1 capsule (20 mg total) by mouth daily., Disp: 30 capsule, Rfl: 5 .  triazolam (HALCION) 0.25 MG tablet, Take 1 tablet (0.25 mg total) by mouth at bedtime as needed (sleep; Take 1.5 by mouth daily)., Disp: 45 tablet, Rfl: 5   No Known Allergies   Past Surgical History:  Procedure Laterality Date  . KNEE SURGERY Left    x3 reconstruction-[facility]     Family History  Problem Relation Age of Onset  . Cancer Father      Social History   Tobacco Use  . Smoking status: Never Smoker  . Smokeless tobacco: Current User  Substance Use Topics  . Alcohol use: No    With staff assistance, above reviewed with the patient today.  ROS: As per HPI, otherwise no specific complaints on a limited and focused system review   No results found for this or any previous visit (from the past 72 hour(s)).   PHQ2/9: Depression screen [facility] 2/9 07/03/2019 06/10/2019  Decreased Interest 0 0  Down, Depressed, Hopeless 0 0  PHQ - 2 Score 0 0  Altered sleeping 0 0  Tired, decreased energy 0 0    Change in appetite 0 0  Feeling bad or failure about yourself  0 0  Trouble concentrating 0 0  Moving slowly or fidgety/restless 0 0  Suicidal thoughts 0 0  PHQ-9 Score 0 0  Difficult doing work/chores Not difficult at all Not difficult at all   PHQ-2/9 Result is neg  Fall Risk: Fall Risk  07/03/2019 06/10/2019  Falls in the past year? 0 0  Number falls in past yr: 0 0  Injury with Fall? 0 0      Objective:   Vitals:   07/03/19 1106  BP: (!) 140/98  Pulse: 76  Resp: 16  Temp: (!) 97.5 F (36.4 C)  TempSrc: Temporal  SpO2: 100%  Weight: 158 lb (71.7 kg)  Height: '5\' 7"'$  (1.702 m)    Body mass index is 24.75 kg/m.  Physical Exam   NAD, masked HEENT - [place]/AT, sclera anicteric, PERRL, EOMI, conj - non-inj'ed, pharynx clear Neck - supple, no adenopathy, carotids 2+ and = without bruits bilat Car - RRR without m/g/r Pulm- RR and effort normal at rest, CTA without wheeze or rales Abd - soft, NT diffusely, Ext - no LE edema, no active joints Neuro/psychiatric - affect was not flat, appropriate with conversation  Alert and oriented  Grossly non-focal   Speech  normal   Last labs 03/25/19 - TC - 217, LDL - 121, HDL 71 Glc - 101, Comp panel normal otherwise, CBC - normal PSA - 3.1 Hep C Ab - NR    Assessment & Plan:   1. Essential hypertension Patient's blood pressure is still not been very well controlled, both on checks at home, one at the city clinic, and on her follow-up here today.  I did feel adding a second medicine was indicated.  He asked if we did so, how would be aware if his blood pressure was too low, and did review that with him including some orthostasis concerns or feeling lightheaded/near passing out feelings.  Also noted I doubt that would be an issue noting where his readings are presently. Added amlodipine-5 mg once daily, and he is to continue taking the irbesartan daily as well. We will check a basic metabolic panel today as well. Continue with home  blood pressure checks.  Encouraged to try to get a few in the morning as well as the evening. - amLODipine (NORVASC) 5 MG tablet; Take 1 tablet (5 mg total) by mouth daily.  Dispense: 90 tablet; Refill: 3 - BASIC METABOLIC PANEL WITH GFR  2. Attention deficit hyperactivity disorder (ADHD), unspecified ADHD type He remains on the Adderall XR daily, and again noted some concerns with how this may be affecting his blood pressure.   Strongly encouraged days away when possible from this medicine, hoping to do so on weekends. As noted previously, he has been on this medication for a long time, and very strongly feels he needs to continue. Refilled this today, and noted I cannot put refills on it, and he is to let us know when he is in need of a refill again, and continue to monitor use closely. - amphetamine-dextroamphetamine (ADDERALL XR) 20 MG 24 hr capsule; Take 1 capsule (20 mg total) by mouth every morning.  Dispense: 30 capsule; Refill: 0  3. Other insomnia Discussed previously concerns with taking a benzodiazepine medicine like triazolam daily at nighttime and I refer you to my prior note in that regard. As noted previously, felt best to refill that medicine last visit and to continue.  4. Gastroesophageal reflux disease without esophagitis He notes he is without reflux symptoms when taking the omeprazole product daily and he strongly desires to continue doing so.  5. Mixed hyperlipidemia We will recheck a lipid panel today as he did come fasting. Await that result His LDL was in the 120s and his total cholesterol was above 200 on most recent check. - Lipid panel  6. History of prediabetes His prior glucose was 101, just above that desired range of less than 100. We will recheck a blood sugar today, and also add a hemoglobin A1c. - Hemoglobin A1c  7.  Episodic headache Not all that frequent,  and does seem very possible that it is related to higher blood pressure readings. Continue to  monitor presently  He will follow up again in 3 months time, as I noted that is needed when taking medicines like Adderall XR, and can follow-up sooner as needed. Noted if with checking blood pressures at home, the top number is consistently above 140 with the bottom numbers consistently above 90 over the next few weeks after starting the additional blood pressure medicine, he should call for a sooner follow-up.    Towanda Malkin, MD 07/03/19 11:18 AM

## 2019-07-03 ENCOUNTER — Other Ambulatory Visit: Payer: Self-pay

## 2019-07-03 ENCOUNTER — Ambulatory Visit (INDEPENDENT_AMBULATORY_CARE_PROVIDER_SITE_OTHER): Payer: 59 | Admitting: Internal Medicine

## 2019-07-03 ENCOUNTER — Encounter: Payer: Self-pay | Admitting: Internal Medicine

## 2019-07-03 VITALS — BP 140/98 | HR 76 | Temp 97.5°F | Resp 16 | Ht 67.0 in | Wt 158.0 lb

## 2019-07-03 DIAGNOSIS — G4709 Other insomnia: Secondary | ICD-10-CM

## 2019-07-03 DIAGNOSIS — I10 Essential (primary) hypertension: Secondary | ICD-10-CM | POA: Diagnosis not present

## 2019-07-03 DIAGNOSIS — E782 Mixed hyperlipidemia: Secondary | ICD-10-CM

## 2019-07-03 DIAGNOSIS — K219 Gastro-esophageal reflux disease without esophagitis: Secondary | ICD-10-CM

## 2019-07-03 DIAGNOSIS — R519 Headache, unspecified: Secondary | ICD-10-CM | POA: Diagnosis not present

## 2019-07-03 DIAGNOSIS — Z87898 Personal history of other specified conditions: Secondary | ICD-10-CM | POA: Diagnosis not present

## 2019-07-03 DIAGNOSIS — F909 Attention-deficit hyperactivity disorder, unspecified type: Secondary | ICD-10-CM | POA: Diagnosis not present

## 2019-07-03 DIAGNOSIS — R69 Illness, unspecified: Secondary | ICD-10-CM | POA: Diagnosis not present

## 2019-07-03 MED ORDER — AMLODIPINE BESYLATE 5 MG PO TABS: 5.0000 mg | ORAL_TABLET | Freq: Every day | ORAL | 3 refills | Status: DC

## 2019-07-03 MED ORDER — AMPHETAMINE-DEXTROAMPHET ER 20 MG PO CP24: 20.0000 mg | ORAL_CAPSULE | ORAL | 0 refills | Status: DC

## 2019-07-03 NOTE — Patient Instructions (Signed)
A refill of your Adderall XR medicine was provided today. Having days off (not taking this medication) when possible is encouraged. Please contact the office when the next refill of this medication is needed.   Hypertension, Adult Hypertension is another name for high blood pressure. High blood pressure forces your heart to work harder to pump blood. This can cause problems over time. There are two numbers in a blood pressure reading. There is a top number (systolic) over a bottom number (diastolic). It is best to have a blood pressure that is below 120/80. Healthy choices can help lower your blood pressure, or you may need medicine to help lower it. What are the causes? The cause of this condition is not known. Some conditions may be related to high blood pressure. What increases the risk?  Smoking.  Having type 2 diabetes mellitus, high cholesterol, or both.  Not getting enough exercise or physical activity.  Being overweight.  Having too much fat, sugar, calories, or salt (sodium) in your diet.  Drinking too much alcohol.  Having long-term (chronic) kidney disease.  Having a family history of high blood pressure.  Age. Risk increases with age.  Race. You may be at higher risk if you are African American.  Gender. Men are at higher risk than women before age 76. After age 43, women are at higher risk than men.  Having obstructive sleep apnea.  Stress. What are the signs or symptoms?  High blood pressure may not cause symptoms. Very high blood pressure (hypertensive crisis) may cause: ? Headache. ? Feelings of worry or nervousness (anxiety). ? Shortness of breath. ? Nosebleed. ? A feeling of being sick to your stomach (nausea). ? Throwing up (vomiting). ? Changes in how you see. ? Very bad chest pain. ? Seizures. How is this treated?  This condition is treated by making healthy lifestyle changes, such as: ? Eating healthy foods. ? Exercising more. ? Drinking  less alcohol.  Your health care provider may prescribe medicine if lifestyle changes are not enough to get your blood pressure under control, and if: ? Your top number is above 130. ? Your bottom number is above 80.  Your personal target blood pressure may vary. Follow these instructions at home: Eating and drinking   If told, follow the DASH eating plan. To follow this plan: ? Fill one half of your plate at each meal with fruits and vegetables. ? Fill one fourth of your plate at each meal with whole grains. Whole grains include whole-wheat pasta, brown rice, and whole-grain bread. ? Eat or drink low-fat dairy products, such as skim milk or low-fat yogurt. ? Fill one fourth of your plate at each meal with low-fat (lean) proteins. Low-fat proteins include fish, chicken without skin, eggs, beans, and tofu. ? Avoid fatty meat, cured and processed meat, or chicken with skin. ? Avoid pre-made or processed food.  Eat less than 1,500 mg of salt each day.  Do not drink alcohol if: ? Your doctor tells you not to drink. ? You are pregnant, may be pregnant, or are planning to become pregnant.  If you drink alcohol: ? Limit how much you use to:  0-1 drink a day for women.  0-2 drinks a day for men. ? Be aware of how much alcohol is in your drink. In the [place] one drink equals one 12 oz bottle of beer (355 mL), one 5 oz glass of wine (148 mL), or one 1 oz glass of hard liquor (44 mL).  Lifestyle   Work with your doctor to stay at a healthy weight or to lose weight. Ask your doctor what the best weight is for you.  Get at least 30 minutes of exercise most days of the week. This may include walking, swimming, or biking.  Get at least 30 minutes of exercise that strengthens your muscles (resistance exercise) at least 3 days a week. This may include lifting weights or doing Pilates.  Do not use any products that contain nicotine or tobacco, such as cigarettes, e-cigarettes, and chewing  tobacco. If you need help quitting, ask your doctor.  Check your blood pressure at home as told by your doctor.  Keep all follow-up visits as told by your doctor. This is important. Medicines  Take over-the-counter and prescription medicines only as told by your doctor. Follow directions carefully.  Do not skip doses of blood pressure medicine. The medicine does not work as well if you skip doses. Skipping doses also puts you at risk for problems.  Ask your doctor about side effects or reactions to medicines that you should watch for. Contact a doctor if you:  Think you are having a reaction to the medicine you are taking.  Have headaches that keep coming back (recurring).  Feel dizzy.  Have swelling in your ankles.  Have trouble with your vision. Get help right away if you:  Get a very bad headache.  Start to feel mixed up (confused).  Feel weak or numb.  Feel faint.  Have very bad pain in your: ? Chest. ? Belly (abdomen).  Throw up more than once.  Have trouble breathing. Summary  Hypertension is another name for high blood pressure.  High blood pressure forces your heart to work harder to pump blood.  For most people, a normal blood pressure is less than 120/80.  Making healthy choices can help lower blood pressure. If your blood pressure does not get lower with healthy choices, you may need to take medicine. This information is not intended to replace advice given to you by your health care provider. Make sure you discuss any questions you have with your health care provider. Document Revised: 11/08/2017 Document Reviewed: 11/08/2017 Elsevier Patient Education  2020 [employer].

## 2019-07-04 LAB — LIPID PANEL
Cholesterol: 182 mg/dL (ref <200)
HDL: 68 mg/dL (ref >=40)
LDL Cholesterol (Calc): 100 mg/dL (calc) — ABNORMAL HIGH
Non-HDL Cholesterol (Calc): 114 mg/dL (calc) (ref <130)
Total CHOL/HDL Ratio: 2.7 (calc) (ref <5.0)
Triglycerides: 46 mg/dL (ref <150)

## 2019-07-04 LAB — BASIC METABOLIC PANEL WITH GFR
BUN: 8 mg/dL (ref 7–25)
CO2: 27 mmol/L (ref 20–32)
Calcium: 9.9 mg/dL (ref 8.6–10.3)
Chloride: 101 mmol/L (ref 98–110)
Creat: 0.97 mg/dL (ref 0.70–1.33)
GFR, Est African American: 104 mL/min/{1.73_m2} (ref >=60)
GFR, Est Non African American: 90 mL/min/{1.73_m2} (ref >=60)
Glucose, Bld: 98 mg/dL (ref 65–99)
Potassium: 4.9 mmol/L (ref 3.5–5.3)
Sodium: 138 mmol/L (ref 135–146)

## 2019-07-04 LAB — HEMOGLOBIN A1C
Hgb A1c MFr Bld: 5.2 % of total Hgb (ref <5.7)
Mean Plasma Glucose: 103 (calc)
eAG (mmol/L): 5.7 (calc)

## 2019-08-13 ENCOUNTER — Other Ambulatory Visit: Payer: Self-pay | Admitting: Internal Medicine

## 2019-08-13 ENCOUNTER — Other Ambulatory Visit: Payer: Self-pay

## 2019-08-13 DIAGNOSIS — F909 Attention-deficit hyperactivity disorder, unspecified type: Secondary | ICD-10-CM

## 2019-08-13 NOTE — Telephone Encounter (Signed)
Patient is requesting a refill for Adderall, he states he is completely out.  Patient uses, [id number] [name], [place] - [address]  [place] [address]  Phone: [phone] Fax: [phone]

## 2019-08-13 NOTE — Telephone Encounter (Signed)
Requested medication (s) are due for refill today: yes  Requested medication (s) are on the active medication list: yes  Last refill: 07/03/2019   #30  0 refills  Future visit scheduled yes  10/02/2019  Notes to clinic: Not delegated  Requested Prescriptions  Pending Prescriptions Disp Refills   amphetamine-dextroamphetamine (ADDERALL XR) 20 MG 24 hr capsule 30 capsule 0    Sig: Take 1 capsule (20 mg total) by mouth every morning.      There is no refill protocol information for this order

## 2019-08-14 ENCOUNTER — Other Ambulatory Visit: Payer: Self-pay | Admitting: Internal Medicine

## 2019-08-14 DIAGNOSIS — F909 Attention-deficit hyperactivity disorder, unspecified type: Secondary | ICD-10-CM

## 2019-08-14 MED ORDER — AMPHETAMINE-DEXTROAMPHET ER 20 MG PO CP24: 20.0000 mg | ORAL_CAPSULE | ORAL | 0 refills | Status: DC

## 2019-08-14 NOTE — Telephone Encounter (Signed)
Renewed the Adderall XR medicine for him. Please let him know. Thanks.

## 2019-08-14 NOTE — Progress Notes (Signed)
Refilled the Adderall XR. Continuing to monitor use presently.

## 2019-09-09 ENCOUNTER — Other Ambulatory Visit: Payer: Self-pay | Admitting: Internal Medicine

## 2019-09-09 ENCOUNTER — Telehealth: Payer: Self-pay

## 2019-09-09 DIAGNOSIS — I10 Essential (primary) hypertension: Secondary | ICD-10-CM

## 2019-09-09 DIAGNOSIS — K219 Gastro-esophageal reflux disease without esophagitis: Secondary | ICD-10-CM

## 2019-09-09 DIAGNOSIS — F909 Attention-deficit hyperactivity disorder, unspecified type: Secondary | ICD-10-CM

## 2019-09-09 NOTE — Telephone Encounter (Signed)
Patient's insurance will only cover 90 pills a year of his Omeprazole 20 mg. They will only supply him 30 pills per 45 days. He wants a letter written to his insurance that both of these medications are a medical necessity so they can approve the medication as written.   Copied from CRM [id number]. Topic: General - Other >> Sep 09, 2019 10:53 AM Angela Nevin wrote: Patient requesting call back from clinical staff to discuss the need for a letter of necessity for medications. Patient unsure of name of medications. Please advise.

## 2019-09-09 NOTE — Telephone Encounter (Signed)
Requested medication (s) are due for refill today: no  Requested medication (s) are on the active medication list: yes  Last refill:  08/14/2019  Future visit scheduled: yes  Notes to clinic:  this refill cannot be delegated    Requested Prescriptions  Pending Prescriptions Disp Refills   amphetamine-dextroamphetamine (ADDERALL XR) 20 MG 24 hr capsule 30 capsule 0    Sig: Take 1 capsule (20 mg total) by mouth every morning.      There is no refill protocol information for this order

## 2019-09-09 NOTE — Telephone Encounter (Signed)
amphetamine-dextroamphetamine (ADDERALL XR) 20 MG 24 hr capsule      Patient is requesting refill of this medication. Patient also requesting two medications for "blood pressure", patient was unsure of name. Please advise.   Pharmacy:  [id number] [name], [place] - [address] Phone:  [phone]  Fax:  [phone]

## 2019-09-10 MED ORDER — AMPHETAMINE-DEXTROAMPHET ER 20 MG PO CP24: 20.0000 mg | ORAL_CAPSULE | ORAL | 0 refills | Status: DC

## 2019-09-13 NOTE — Telephone Encounter (Signed)
I spoke with the patient's insurance, they have opened a request via covermymeds that I will complete for his Omeprazole and Triazolam.

## 2019-09-24 ENCOUNTER — Telehealth: Payer: Self-pay

## 2019-09-24 NOTE — Telephone Encounter (Signed)
30 day supply of Omepazole approved for patient:  Logan Bruce Key: [id number] - PA Case ID: [id number] Need help? Call us at [phone] Outcome Approved today Your PA request has been approved. Additional information will be provided in the approval communication. (Message 1145)  Submitted request for approval quanity #45 for Triazolam, request pending. Key [id number]

## 2019-10-01 NOTE — Progress Notes (Signed)
Patient ID: Logan Bruce, male    DOB: 08-Mar-1966, 54 y.o.   MRN: [id number]  PCP: Jamelle Haring, MD  Chief Complaint  Patient presents with  . Follow-up  . Hypertension    medication refill    Subjective:   Logan Bruce is a 54 y.o. male, presents to clinic with CC of the following:  Chief Complaint  Patient presents with  . Follow-up  . Hypertension    medication refill    HPI:  Patient is a 54 year old male My last visit with him was about 3 months ago. Communication on the lab results after the last visit were as follows:   The basic metabolic panel was all good with the glucose 98, and the kidney function, electrolytes, and calcium level normal. The hemoglobin A1c was good at 5.2. The lipid panel was also good, improved from the previous one done, with the cholesterol 182, the HDL cholesterol (healthy type) 68, and the LDL cholesterol (lousy type) just at the top normal of the desired range at 100. He follows up today. In general, he notes he has been feeling good.  HTNhx. medication regimen - irbesartan -300 mg daily, amlodipine 5 mg daily,   Taking medications regularly Has check blood pressures at home when first started amlodipine 130-135/90-95, not checked in last couple weeks BP Readings from Last 3 Encounters:  10/02/19 134/90  07/03/19 (!) 140/98  06/17/19 (!) 147/99   Denies CP, palpitations, SOB, increased HA's, vision changes, increased LE swelling  Weight has remained stable Wt Readings from Last 3 Encounters:  10/02/19 159 lb 14.4 oz (72.5 kg)  07/03/19 158 lb (71.7 kg)  06/10/19 158 lb 14.4 oz (72.1 kg)     ADHD Medication regimen-Adderall XR Noted he has been on a stimulant medicine like Adderall since almost elementary school. It helps him stay focused, especially with a job like he has presently. He continues to take it daily.  Denies any sideeffects from this medicine. No palpitations, chest pains,  shortness of breath, heart racing. He does miss a day on a weekend at times, often on Sunday.  Strongly encouraged days away when possible.  To review - He had evaluations when he was really young in school, although has had no real evaluations done more recently. He does feel he needs this for his occupation. I did discuss concerns with the stimulant medicine and the length of time he has been taking it previously.  GERD; He had been taking omeprazole daily, not taken in over a month as awaiting insurance approval. Did get OTC nexium to use as one night very problematic. Has had sx's several times since off the med, would like to restart. He had been on this for years. It is another medicine he states he really cannot do without and would like to restart.  He denies any recent chronic abdominal pains, black or dark stools, bleeding per rectum.  Insomnia: On triazolam qhs  To review - He has been taking triazolam at bedtime to help him sleep for years. Recently, that was increased to 1-1/2 tablets from 1. He states he has tried multiple other entities over time and spent a lot of money and co-pays for medicines that just made him groggy during the day, did not work, and this 1 works great for him. He states if he does not have it, he cannot sleep for more than 15 minutes, and that he just simply cannot work.   I  discussed at length my concerns with routine use of triazolam previously, a benzodiazepine for sleep. He has been on this for years, and very adamant that he has tried a lot of other entities which have not been helpful. He is in no way wants to try any other entities at this point, and states he just simply cannot do without this especially with respect to his job. Did discuss my concerns with this medicine including tachyphylaxis and dependence issues.  It was refilled for him at the last visit.  It was, however, not approved by his insurance company with that letter not  approving noted and he states he will just pay for this medicine without insurance help, as it is an essential medicine for him.   Patient Active Problem List   Diagnosis Date Noted  . Essential hypertension 06/10/2019  . Attention deficit hyperactivity disorder (ADHD) 06/10/2019  . Other insomnia 06/10/2019  . Gastroesophageal reflux disease without esophagitis 06/10/2019  . Tendinitis of thumb 06/10/2019  . Mixed hyperlipidemia 06/10/2019  . History of prediabetes 06/10/2019      Current Outpatient Medications:  .  amLODipine (NORVASC) 5 MG tablet, Take 1 tablet (5 mg total) by mouth daily., Disp: 90 tablet, Rfl: 3 .  amphetamine-dextroamphetamine (ADDERALL XR) 20 MG 24 hr capsule, Take 1 capsule (20 mg total) by mouth every morning., Disp: 30 capsule, Rfl: 0 .  irbesartan (AVAPRO) 300 MG tablet, Take 1 tablet (300 mg total) by mouth daily., Disp: 90 tablet, Rfl: 1 .  omeprazole (PRILOSEC) 20 MG capsule, Take 1 capsule (20 mg total) by mouth daily., Disp: 30 capsule, Rfl: 5 .  triazolam (HALCION) 0.25 MG tablet, Take 1 tablet (0.25 mg total) by mouth at bedtime as needed (sleep; Take 1.5 by mouth daily)., Disp: 45 tablet, Rfl: 5   No Known Allergies   Past Surgical History:  Procedure Laterality Date  . KNEE SURGERY Left    x3 reconstruction-[facility]     Family History  Problem Relation Age of Onset  . Cancer Father      Social History   Tobacco Use  . Smoking status: Never Smoker  . Smokeless tobacco: Current User  Substance Use Topics  . Alcohol use: No    With staff assistance, above reviewed with the patient today.  ROS: As per HPI, otherwise no specific complaints on a limited and focused system review   No results found for this or any previous visit (from the past 72 hour(s)).   PHQ2/9: Depression screen [facility] 10/02/2019 07/03/2019 06/10/2019  Decreased Interest 0 0 0  Down, Depressed, Hopeless 0 0 0  PHQ - 2 Score 0 0 0    Altered sleeping 0 0 0  Tired, decreased energy 0 0 0  Change in appetite 0 0 0  Feeling bad or failure about yourself  0 0 0  Trouble concentrating 0 0 0  Moving slowly or fidgety/restless 0 0 0  Suicidal thoughts 0 0 0  PHQ-9 Score 0 0 0  Difficult doing work/chores Not difficult at all Not difficult at all Not difficult at all   PHQ-2/9 Result is neg  Fall Risk: Fall Risk  10/02/2019 07/03/2019 06/10/2019  Falls in the past year? 0 0 0  Number falls in past yr: 0 0 0  Injury with Fall? 0 0 0      Objective:   Vitals:   10/02/19 1030  BP: 134/90  Pulse: 100  Resp: 16  Temp: 98.5 F (  36.9 C)  TempSrc: Temporal  SpO2: 100%  Weight: 159 lb 14.4 oz (72.5 kg)  Height: 5\' 7"  (1.702 m)    Body mass index is 25.04 kg/m.  Recheck of blood pressure by me on exam was 140/92 with adult cuff on the left  Physical Exam   NAD, masked HEENT - [place]/AT, sclera anicteric, PERRL, EOMI, conj - non-inj'ed, pharynx clear Neck - supple, no adenopathy, no TM, carotids 2+ and = without bruits bilat Car - RRR without m/g/r Pulm- RR and effort normal at rest, CTA without wheeze or rales Abd - soft, NT diffusely, ND,  Back - no CVA tenderness Ext - no LE edema,  Neuro/psychiatric - affect was not flat, appropriate with conversation  Alert and oriented  Grossly non-focal   Speech and gait are normal   Results for orders placed or performed in visit on 07/03/19  BASIC METABOLIC PANEL WITH GFR  Result Value Ref Range   Glucose, Bld 98 65 - 99 mg/dL   BUN 8 7 - 25 mg/dL   Creat 07/05/19 7.90 - 2.40 mg/dL   GFR, Est Non African American 90 > OR = 60 mL/min/1.71m2   GFR, Est African American 104 > OR = 60 mL/min/1.61m2   BUN/Creatinine Ratio NOT APPLICABLE 6 - 22 (calc)   Sodium 138 135 - 146 mmol/L   Potassium 4.9 3.5 - 5.3 mmol/L   Chloride 101 98 - 110 mmol/L   CO2 27 20 - 32 mmol/L   Calcium 9.9 8.6 - 10.3 mg/dL  Lipid panel  Result Value Ref Range   Cholesterol 182 <200 mg/dL   HDL  68 > OR = 40 mg/dL   Triglycerides 46 75m mg/dL   LDL Cholesterol (Calc) 100 (H) mg/dL (calc)   Total CHOL/HDL Ratio 2.7 <5.0 (calc)   Non-HDL Cholesterol (Calc) 114 <130 mg/dL (calc)  Hemoglobin <532  Result Value Ref Range   Hgb A1c MFr Bld 5.2 <5.7 % of total Hgb   Mean Plasma Glucose 103 (calc)   eAG (mmol/L) 5.7 (calc)   Last labs reviewed Assessment & Plan:   1. Essential hypertension Blood pressure still not as well controlled as desired, as would like the diastolic blood pressure less than 90.  Discussed this with him today. We will increase the Norvasc to 10 mg daily Continue the irbesartan product. Recommended checking blood pressures at home a few days after starting the increased Norvasc dose to help monitor. 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 - amLODipine (NORVASC) 10 MG tablet; Take 1 tablet (10 mg total) by mouth daily.  Dispense: 90 tablet; Refill: 3  2. Gastroesophageal reflux disease without esophagitis Refill the omeprazole today as it was not approved by his insurance. He tried to be off of it over the last month, although is having fairly frequent symptoms, and would like to return to using it daily. Is aware of the long-term concerns with daily PPI use, and noting the long-term concerns of chronic reflux, will continue the omeprazole daily presently. - omeprazole (PRILOSEC) 20 MG capsule; Take 1 capsule (20 mg total) by mouth daily.  Dispense: 90 capsule; Refill: 1  3. Attention deficit hyperactivity disorder (ADHD), unspecified ADHD type Continues on Adderall XR daily, and encouraged days away as much as possible again today. Again briefly noted some concerns with long-term use of this product, and did  note it may be contributing to his blood pressure elevation. He is also aware of how to closely monitor this medication is,  and the need to return at least every 3 months to have reassessments. Refill of this medicine today.  4. Other insomnia Discussed the concerns with triazolam again today, and he notes that it is essential for him to continue, and he will pay for this without insurance is help. Encouraged him to try to lessen to 1 at night rather than 1-1/2 over time, and he stated the day he sometimes does have nights where he just takes 1 and not 1-1/2 presently.  Hoping to get to just taking 1 a night over time to lessen use.  5. Mixed hyperlipidemia Reviewed his last lipid panel, with the LDL just slightly above the desired goal. Continue to monitor.  We will follow-up again in 3 months time, sooner as needed.    Jamelle Haring, MD 10/02/19 10:44 AM

## 2019-10-02 ENCOUNTER — Encounter: Payer: Self-pay | Admitting: Internal Medicine

## 2019-10-02 ENCOUNTER — Ambulatory Visit (INDEPENDENT_AMBULATORY_CARE_PROVIDER_SITE_OTHER): Payer: 59 | Admitting: Internal Medicine

## 2019-10-02 ENCOUNTER — Other Ambulatory Visit: Payer: Self-pay

## 2019-10-02 VITALS — BP 134/90 | HR 100 | Temp 98.5°F | Resp 16 | Ht 67.0 in | Wt 159.9 lb

## 2019-10-02 DIAGNOSIS — G4709 Other insomnia: Secondary | ICD-10-CM

## 2019-10-02 DIAGNOSIS — E782 Mixed hyperlipidemia: Secondary | ICD-10-CM

## 2019-10-02 DIAGNOSIS — I10 Essential (primary) hypertension: Secondary | ICD-10-CM

## 2019-10-02 DIAGNOSIS — R69 Illness, unspecified: Secondary | ICD-10-CM | POA: Diagnosis not present

## 2019-10-02 DIAGNOSIS — K219 Gastro-esophageal reflux disease without esophagitis: Secondary | ICD-10-CM | POA: Diagnosis not present

## 2019-10-02 DIAGNOSIS — F909 Attention-deficit hyperactivity disorder, unspecified type: Secondary | ICD-10-CM | POA: Diagnosis not present

## 2019-10-02 MED ORDER — AMLODIPINE BESYLATE 10 MG PO TABS: 10.0000 mg | ORAL_TABLET | Freq: Every day | ORAL | 3 refills | Status: DC

## 2019-10-02 MED ORDER — AMPHETAMINE-DEXTROAMPHET ER 20 MG PO CP24: 20.0000 mg | ORAL_CAPSULE | Freq: Every day | ORAL | 0 refills | Status: DC

## 2019-10-02 MED ORDER — AMPHETAMINE-DEXTROAMPHET ER 20 MG PO CP24: 20.0000 mg | ORAL_CAPSULE | ORAL | 0 refills | Status: DC

## 2019-10-02 MED ORDER — OMEPRAZOLE 20 MG PO CPDR: 20.0000 mg | DELAYED_RELEASE_CAPSULE | Freq: Every day | ORAL | 1 refills | Status: DC

## 2019-10-24 ENCOUNTER — Other Ambulatory Visit: Payer: 59

## 2019-10-24 DIAGNOSIS — Z1152 Encounter for screening for COVID-19: Secondary | ICD-10-CM

## 2019-10-24 NOTE — Progress Notes (Signed)
Pt present for covid screening due to fever this morning. Pt has not had a fever since and absent of all other symptoms.Boss made him come in to get tested to be sure. CL,RMA

## 2019-10-26 LAB — SARS-COV-2, NAA 2 DAY TAT

## 2019-10-26 LAB — NOVEL CORONAVIRUS, NAA: SARS-CoV-2, NAA: NOT DETECTED

## 2019-11-13 ENCOUNTER — Other Ambulatory Visit: Payer: Self-pay | Admitting: Internal Medicine

## 2019-11-13 DIAGNOSIS — F909 Attention-deficit hyperactivity disorder, unspecified type: Secondary | ICD-10-CM

## 2019-11-13 NOTE — Telephone Encounter (Signed)
Pt is calling to speak with the nurse. Pt is requesting a refill on adderall xr 20 mg generic is ok per pt. Pt would like the medication to be refill today . [facility] [place] [address]

## 2019-11-20 ENCOUNTER — Other Ambulatory Visit: Payer: Self-pay

## 2019-11-20 DIAGNOSIS — Z1152 Encounter for screening for COVID-19: Secondary | ICD-10-CM

## 2019-11-20 NOTE — Progress Notes (Signed)
Covid test - vaccinated, symptomatic since 11/20/19

## 2019-11-22 LAB — NOVEL CORONAVIRUS, NAA: SARS-CoV-2, NAA: NOT DETECTED

## 2019-11-22 LAB — SARS-COV-2, NAA 2 DAY TAT

## 2019-12-27 ENCOUNTER — Other Ambulatory Visit: Payer: Self-pay | Admitting: Internal Medicine

## 2019-12-27 DIAGNOSIS — G4709 Other insomnia: Secondary | ICD-10-CM

## 2019-12-27 DIAGNOSIS — F909 Attention-deficit hyperactivity disorder, unspecified type: Secondary | ICD-10-CM

## 2019-12-27 DIAGNOSIS — K219 Gastro-esophageal reflux disease without esophagitis: Secondary | ICD-10-CM

## 2019-12-27 NOTE — Telephone Encounter (Addendum)
Pt request refill  triazolam (HALCION) 0.25 MG tablet  amphetamine-dextroamphetamine (ADDERALL XR) 20 MG 24 hr capsule  omeprazole (PRILOSEC) 20 MG capsule  [id number] [name], [place] - [address] Phone:  [phone]  Fax:  [phone]     Pt hopes to get today. Pt has appt 01/02/20

## 2019-12-30 ENCOUNTER — Other Ambulatory Visit: Payer: Self-pay | Admitting: Internal Medicine

## 2019-12-30 DIAGNOSIS — G4709 Other insomnia: Secondary | ICD-10-CM

## 2019-12-30 NOTE — Telephone Encounter (Signed)
Medication Refill - Medication: triazolam (HALCION) 0.25 MG tablet (Patient stated that he contacted pharmacy and pharmacy has tried reaching out to office multiple times to have medication sent over. Patient would like his request expedited and stated he only has 1 pill left.)   Has the patient contacted their pharmacy?yes (Agent: If no, request that the patient contact the pharmacy for the refill.) (Agent: If yes, when and what did the pharmacy advise?)contact pcp  Preferred Pharmacy (with phone number or street name):  [phone] [name], [place] Phone:  [phone]  Fax:  [phone]      Agent: Please be advised that RX refills may take up to 3 business days. We ask that you follow-up with your pharmacy.

## 2019-12-30 NOTE — Telephone Encounter (Signed)
Requested medication (s) are due for refill today: yes  Requested medication (s) are on the active medication list: yes  Last refill:  06/10/19 #45 5 refills   Future visit scheduled: yes in 3 days   Notes to clinic:  not delegated per protocol, patient reports only 1 pill left      Requested Prescriptions  Pending Prescriptions Disp Refills   triazolam (HALCION) 0.25 MG tablet 45 tablet 5    Sig: Take 1 tablet (0.25 mg total) by mouth at bedtime as needed (sleep; Take 1.5 by mouth daily).      Not Delegated - Psychiatry:  Anxiolytics/Hypnotics Failed - 12/30/2019  2:34 PM      Failed - This refill cannot be delegated      Failed - Urine Drug Screen completed in last 360 days.      Passed - Valid encounter within last 6 months    Recent Outpatient Visits           2 months ago Attention deficit hyperactivity disorder (ADHD), unspecified ADHD type   [facility] Jamelle Haring, MD   6 months ago Essential hypertension   [facility] Jamelle Haring, MD   6 months ago Essential hypertension   [facility] Jamelle Haring, MD       Future Appointments             In 3 days Jamelle Haring, MD [facility]

## 2019-12-31 ENCOUNTER — Other Ambulatory Visit: Payer: Self-pay | Admitting: Internal Medicine

## 2019-12-31 MED ORDER — TRIAZOLAM 0.25 MG PO TABS: 0.2500 mg | ORAL_TABLET | Freq: Every evening | ORAL | 3 refills | Status: DC | PRN

## 2019-12-31 MED ORDER — AMPHETAMINE-DEXTROAMPHET ER 20 MG PO CP24: 20.0000 mg | ORAL_CAPSULE | Freq: Every day | ORAL | 0 refills | Status: DC

## 2019-12-31 MED ORDER — AMPHETAMINE-DEXTROAMPHET ER 20 MG PO CP24: 20.0000 mg | ORAL_CAPSULE | ORAL | 0 refills | Status: DC

## 2019-12-31 MED ORDER — OMEPRAZOLE 20 MG PO CPDR: 20.0000 mg | DELAYED_RELEASE_CAPSULE | Freq: Every day | ORAL | 1 refills | Status: DC

## 2020-01-01 ENCOUNTER — Other Ambulatory Visit: Payer: Self-pay

## 2020-01-01 DIAGNOSIS — Z1152 Encounter for screening for COVID-19: Secondary | ICD-10-CM

## 2020-01-01 NOTE — Progress Notes (Signed)
Pt presents today with covid symtoms that started 12/31/19 diarrhea, stomach cramps, vomit one time, and scratchy throat watery eyes. CL,RMA

## 2020-01-01 NOTE — Progress Notes (Deleted)
Patient is a 54 year old male My last visit with him was about 3 months ago. Communication on the lab results from his visit about 6 months ago was as follows:                         The basic metabolic panel was all good with the glucose 98, and the kidney function, electrolytes, and calcium level normal. The hemoglobin A1c was good at 5.2. The lipid panel was also good, improved from the previous one done, with the cholesterol 182, the HDL cholesterol (healthy type) 68, and the LDL cholesterol (lousy type) just at the top normal of the desired range at 100. He follows up today. He requested I refill his medications a few days before this follow-up visit, and that was done on Monday, 12/30/2019  In general, he notes he has been feeling good.  HTNhx. medication regimen-irbesartan -300 mg daily, amlodipine 10 mg daily -increased from 5 mg daily last visit(also noted the recent recall of the irbesartan products) Taking medications regularly Does check blood pressures at home -   BP Readings from Last 3 Encounters:  10/02/19 134/90  07/03/19 (!) 140/98  06/17/19 (!) 147/99    Denies CP, palpitations, SOB, increased HA's, vision changes, increased LE swelling  Weight has remained stable Wt Readings from Last 3 Encounters:  10/02/19 159 lb 14.4 oz (72.5 kg)  07/03/19 158 lb (71.7 kg)  06/10/19 158 lb 14.4 oz (72.1 kg)    ADHD Medication regimen-Adderall XR -20 mg Noted he has been on a stimulant medicine like Adderall since almost elementary school. It helps him stay focused, especially with a job like he has presently. He continues to take it daily.  Denies any sideeffects from this medicine. No palpitations, chest pains, shortness of breath, heart racing. He does miss a day on a weekend at times, often on Sunday.  Strongly encourageddays away when possible.  To review -He had evaluations when he was really young in school, although has had no real evaluations done  more recently. He does feel he needs this for his occupation. I did discuss concerns with the stimulant medicine and the length of time he has been taking itpreviously.  GERD; He had been takingomeprazole daily.  (Was off briefly previously waiting insurance approval, with increased symptoms off of the medicine, and subsequently restarted)  It is another medicine he states he really cannot do without   Is aware of the long-term concerns with daily PPI use, and noting the long-term concerns of chronic reflux, will continue the omeprazole daily presently. He denies any recent chronic abdominal pains, black or dark stools, bleeding per rectum.  Insomnia: On triazolam qhs, encouraged him to decrease the dose to just taking 1 at nighttime last visit.  To review -He has been taking triazolam at bedtime to help him sleep for years. He noted it was increased to 1-1/2 tablets from 1 in the fairly recent past. He states he has tried multiple other entities over time and spent a lot of money and co-pays for medicines that just made him groggy during the day, did not work, and this one works great for him. He states if he does not have it, he cannot sleep for more than 15 minutes, and that he just simply cannot work.  I discussed at length my concerns with routine use of triazolampreviously, a benzodiazepine for sleep. He has been on this for years, and very adamant that he has  tried a lot of other entities which have not been helpful. He is in no way wants to try any other entities at this point, and states he just simply cannot do without this especially with respect to his job. Did discuss my concerns with this medicine including tachyphylaxis and dependence issues.  It was refilled for him at the last visit.  It was, however, not approved by his insurance company with that letter not approving noted and he states he will just pay for this medicine without insurance help, as it is an essential  medicine for him.   Mixed hyperlipidemia Medication regimen-none Lab Results  Component Value Date   CHOL 182 07/03/2019   HDL 68 07/03/2019   LDLCALC 100 (H) 07/03/2019   TRIG 46 07/03/2019   CHOLHDL 2.7 07/03/2019    NAD, masked HEENT - [place]/AT, sclera anicteric, PERRL, EOMI, conj - non-inj'ed, pharynx clear Neck - supple, no adenopathy, no TM, carotids 2+ and = without bruits bilat Car - RRR without m/g/r Pulm- RR and effort normal at rest, CTA without wheeze or rales Abd - soft, NT diffusely, ND,  Back - no CVA tenderness Ext - no LE edema,  Neuro/psychiatric - affect was not flat, appropriate with conversation             Alert and oriented             Grossly non-focal              Speech and gait are normal   1. Essential hypertension  Continue the irbesartan product.  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   2. Gastroesophageal reflux disease without esophagitis Refilled the omeprazole immediately prior to this visit He tried to be off of it in the recent past although was having fairly frequent symptoms, and return to daily use Is aware of the long-term concerns with daily PPI use, and noting the long-term concerns of chronic reflux, will continue the omeprazole daily presently.  3. Attention deficit hyperactivity disorder (ADHD), unspecified ADHD type Continues on Adderall XR daily, and encouraged days away as much as possible again today. Again briefly noted some concerns with long-term use of this product, and did note it may be contributing to his blood pressure elevation. He is also aware of how to closely monitor this medication is, and the need to return at least every 3 months to have reassessments. Refilled this medication immediately prior to this visit  4. Other insomnia Discussed the concerns with triazolam again  today, and he notes that it is essential for him to continue, and he will pay for this without insurance is help. Encouraged him to try to lessen to 1 at night rather than 1-1/2 over time, and he stated the day he sometimes does have nights where he just takes 1 and not 1-1/2 presently.  Hoping to get to just taking 1 a night over time to lessen use.  5. Mixed hyperlipidemia Reviewed his last lipid panel, with the LDL just slightly above the desired goal. Continue to monitor.  We will follow-up again in 3 months time, sooner as needed

## 2020-01-02 ENCOUNTER — Ambulatory Visit: Payer: 59 | Admitting: Internal Medicine

## 2020-01-02 LAB — NOVEL CORONAVIRUS, NAA: SARS-CoV-2, NAA: NOT DETECTED

## 2020-01-02 LAB — SARS-COV-2, NAA 2 DAY TAT

## 2020-02-24 ENCOUNTER — Encounter: Payer: Self-pay | Admitting: Nurse Practitioner

## 2020-02-24 ENCOUNTER — Other Ambulatory Visit: Payer: Self-pay

## 2020-02-24 ENCOUNTER — Ambulatory Visit: Payer: Self-pay | Admitting: Nurse Practitioner

## 2020-02-24 DIAGNOSIS — F909 Attention-deficit hyperactivity disorder, unspecified type: Secondary | ICD-10-CM

## 2020-02-24 MED ORDER — AMPHETAMINE-DEXTROAMPHET ER 20 MG PO CP24: 20.0000 mg | ORAL_CAPSULE | ORAL | 0 refills | Status: DC

## 2020-02-24 NOTE — Progress Notes (Signed)
Saw  Dr. Chilton Si in [place] for over 20 years & the physician retired.  Saw Dr. Dorris Fetch at [facility].  Saw Dr. Daryel November 2-3 visits at the recommendation of S. Stone, RN & then Dr. Mayford Knife informed him he would no longer be able to see him.  Physical through our clinic scheduled for next week.  AMD

## 2020-02-24 NOTE — Progress Notes (Signed)
   Subjective:    Patient ID: MAC DOWDELL, male    DOB: [birth year], 54 y.o.   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   Has not had a physical with the clinic yet-  Denies any new concerns-mainly needs adderall refill today   Review of Systems  Constitutional: Negative.   Respiratory: Negative.   Cardiovascular: Negative.   Neurological: Negative.    Today's Vitals   02/24/20 1521  BP: 130/90  Pulse: 88  Resp: 12  Temp: 98.7 F (37.1 C)  TempSrc: Oral  SpO2: 98%  Weight: 161 lb (73 kg)  Height: 5\' 6"  (1.676 m)   Body mass index is 25.99 kg/m.    Objective:   Physical Exam Neurological:     Mental Status: He is alert.  Psychiatric:        Attention and Perception: Attention normal.        Mood and Affect: Mood normal.        Speech: Speech normal.        Behavior: Behavior is cooperative.        Thought Content: Thought content normal.        Cognition and Memory: Cognition normal.        Judgment: Judgment normal.           Assessment & Plan:  Verified with Dr. may provide refill on Adderall while awaiting full time physician at [facility] clinic to take over practice.   Will provide 30 day refill today Patient scheduled for labs tomorrow and physical next week.   RTC with any other concerns as needed

## 2020-02-25 ENCOUNTER — Ambulatory Visit: Payer: Self-pay

## 2020-02-25 DIAGNOSIS — Z01818 Encounter for other preprocedural examination: Secondary | ICD-10-CM

## 2020-02-25 LAB — POCT URINALYSIS DIPSTICK
Bilirubin, UA: NEGATIVE
Blood, UA: NEGATIVE
Glucose, UA: NEGATIVE
Ketones, UA: NEGATIVE
Leukocytes, UA: NEGATIVE
Nitrite, UA: NEGATIVE
Protein, UA: NEGATIVE
Spec Grav, UA: 1.025 (ref 1.010–1.025)
Urobilinogen, UA: 0.2 E.U./dL
pH, UA: 6 (ref 5.0–8.0)

## 2020-02-25 NOTE — Progress Notes (Signed)
Pt scheduled to complete physical with ron smith,PA-C 03/02/20.  CL,RMA

## 2020-02-26 LAB — CMP12+LP+TP+TSH+6AC+PSA+CBC…
ALT: 27 IU/L (ref 0–44)
AST: 28 IU/L (ref 0–40)
Albumin/Globulin Ratio: 2.1 (ref 1.2–2.2)
Albumin: 4.4 g/dL (ref 3.8–4.9)
Alkaline Phosphatase: 68 IU/L (ref 44–121)
BUN/Creatinine Ratio: 9 (ref 9–20)
BUN: 9 mg/dL (ref 6–24)
Basophils Absolute: 0 10*3/uL (ref 0.0–0.2)
Basos: 1 %
Bilirubin Total: 0.3 mg/dL (ref 0.0–1.2)
Calcium: 9.1 mg/dL (ref 8.7–10.2)
Chloride: 100 mmol/L (ref 96–106)
Chol/HDL Ratio: 2.9 ratio (ref 0.0–5.0)
Cholesterol, Total: 186 mg/dL (ref 100–199)
Creatinine, Ser: 1.03 mg/dL (ref 0.76–1.27)
EOS (ABSOLUTE): 0.1 10*3/uL (ref 0.0–0.4)
Eos: 3 %
Estimated CHD Risk: <0.5 times avg. (ref 0.0–1.0)
Free Thyroxine Index: 1.7 (ref 1.2–4.9)
GFR calc Af Amer: 96 mL/min/{1.73_m2} (ref >59)
GFR calc non Af Amer: 83 mL/min/{1.73_m2} (ref >59)
GGT: 20 IU/L (ref 0–65)
Globulin, Total: 2.1 g/dL (ref 1.5–4.5)
Glucose: 101 mg/dL — ABNORMAL HIGH (ref 65–99)
HDL: 65 mg/dL (ref >39)
Hematocrit: 43.8 % (ref 37.5–51.0)
Hemoglobin: 15 g/dL (ref 13.0–17.7)
Immature Grans (Abs): 0 10*3/uL (ref 0.0–0.1)
Immature Granulocytes: 0 %
Iron: 83 ug/dL (ref 38–169)
LDH: 201 IU/L (ref 121–224)
LDL Chol Calc (NIH): 105 mg/dL — ABNORMAL HIGH (ref 0–99)
Lymphocytes Absolute: 1 10*3/uL (ref 0.7–3.1)
Lymphs: 27 %
MCH: 32.3 pg (ref 26.6–33.0)
MCHC: 34.2 g/dL (ref 31.5–35.7)
MCV: 94 fL (ref 79–97)
Monocytes Absolute: 0.4 10*3/uL (ref 0.1–0.9)
Monocytes: 11 %
Neutrophils Absolute: 2.1 10*3/uL (ref 1.4–7.0)
Neutrophils: 58 %
Phosphorus: 3.6 mg/dL (ref 2.8–4.1)
Platelets: 268 10*3/uL (ref 150–450)
Potassium: 5 mmol/L (ref 3.5–5.2)
Prostate Specific Ag, Serum: 1.3 ng/mL (ref 0.0–4.0)
RBC: 4.64 x10E6/uL (ref 4.14–5.80)
RDW: 11.7 % (ref 11.6–15.4)
Sodium: 140 mmol/L (ref 134–144)
T3 Uptake Ratio: 30 % (ref 24–39)
T4, Total: 5.5 ug/dL (ref 4.5–12.0)
TSH: 1.79 u[IU]/mL (ref 0.450–4.500)
Total Protein: 6.5 g/dL (ref 6.0–8.5)
Triglycerides: 86 mg/dL (ref 0–149)
Uric Acid: 6.3 mg/dL (ref 3.8–8.4)
VLDL Cholesterol Cal: 16 mg/dL (ref 5–40)
WBC: 3.6 10*3/uL (ref 3.4–10.8)

## 2020-03-02 ENCOUNTER — Encounter: Payer: 59 | Admitting: Physician Assistant

## 2020-03-04 ENCOUNTER — Other Ambulatory Visit: Payer: Self-pay | Admitting: Physician Assistant

## 2020-03-04 ENCOUNTER — Ambulatory Visit: Payer: Self-pay | Admitting: Physician Assistant

## 2020-03-04 ENCOUNTER — Encounter: Payer: Self-pay | Admitting: Physician Assistant

## 2020-03-04 ENCOUNTER — Other Ambulatory Visit: Payer: Self-pay

## 2020-03-04 VITALS — BP 142/104 | HR 77 | Temp 97.7°F | Resp 14 | Ht 67.0 in | Wt 160.0 lb

## 2020-03-04 DIAGNOSIS — F909 Attention-deficit hyperactivity disorder, unspecified type: Secondary | ICD-10-CM

## 2020-03-04 DIAGNOSIS — G4709 Other insomnia: Secondary | ICD-10-CM

## 2020-03-04 DIAGNOSIS — Z01818 Encounter for other preprocedural examination: Secondary | ICD-10-CM

## 2020-03-04 DIAGNOSIS — Z0130 Encounter for examination of blood pressure without abnormal findings: Secondary | ICD-10-CM

## 2020-03-04 MED ORDER — TRIAMTERENE-HCTZ 37.5-25 MG PO TABS: 1.0000 | ORAL_TABLET | Freq: Every day | ORAL | 3 refills | Status: DC

## 2020-03-04 MED ORDER — TRIAZOLAM 0.25 MG PO TABS: 0.2500 mg | ORAL_TABLET | Freq: Every evening | ORAL | 0 refills | Status: DC | PRN

## 2020-03-04 MED ORDER — AMPHETAMINE-DEXTROAMPHET ER 20 MG PO CP24: 20.0000 mg | ORAL_CAPSULE | ORAL | 0 refills | Status: DC

## 2020-03-04 NOTE — Progress Notes (Signed)
   Subjective:Routine physical examination    Patient ID: Logan Bruce, male    DOB: 05/26/1965, 54 y.o.   MRN: [id number]  HPI Patient presents today for physical exam.  Patient initially voices no concerns or complaints.   Review of Systems    ADHD, hypertension, GERD, insomnia, and history of excessive alcohol intake. Objective:   Physical Exam Patient of.  No acute distress.  There was a visible lower extremity tremors.  HEENT was unremarkable.  Neck was supple for appendectomy or bruits.  Lungs are clear to auscultation.  Heart was regular rate and rhythm.  Abdomen with negative HSM, normoactive bowel sounds, soft, nontender to palpation.  Patient has bilateral scapular winging.  Patient decreased range of motion overhead reaching.  Patient strength was 4/5.  Examination of lower extremities shows that the left thigh was small in comparison to the right thigh.  Patient has decreased strength left lower extremity against resistance..Patient's skin with rubor appearance. Cranial nerves II through XII grossly intact.    Assessment & Plan: ADHD, GERD, hypertension, insomnia, and excessive alcohol intake.  Patient advised to follow-up 1 week status post labs. Patient also be consulted for sleep study.   Patient educated to notify a healthcare professional if they develop further symptoms that include chest pain, arrhythmias/palpitations, prolonged SOB or fever, or other concerning symptoms.

## 2020-03-12 ENCOUNTER — Other Ambulatory Visit: Payer: Self-pay

## 2020-03-12 DIAGNOSIS — I10 Essential (primary) hypertension: Secondary | ICD-10-CM

## 2020-03-12 MED ORDER — IRBESARTAN 300 MG PO TABS: 300.0000 mg | ORAL_TABLET | Freq: Every day | ORAL | 1 refills | Status: DC

## 2020-03-16 ENCOUNTER — Other Ambulatory Visit: Payer: Self-pay

## 2020-03-16 ENCOUNTER — Ambulatory Visit: Payer: Self-pay

## 2020-03-16 VITALS — BP 111/85 | HR 106 | Resp 12

## 2020-03-16 DIAGNOSIS — Z0130 Encounter for examination of blood pressure without abnormal findings: Secondary | ICD-10-CM

## 2020-03-16 NOTE — Progress Notes (Signed)
Brought home monitor for comparison. Left arm BP 122/91  States dystolic BP was 108 last week when in the clinic.  Started BP medication 2 days ago.  AMD

## 2020-03-18 ENCOUNTER — Other Ambulatory Visit: Payer: Self-pay | Admitting: Nurse Practitioner

## 2020-03-18 ENCOUNTER — Telehealth: Payer: Self-pay | Admitting: Family Medicine

## 2020-03-18 NOTE — Telephone Encounter (Signed)
I have tried to reach Logan Bruce on both lines, left a message on his voicemail to call the [facility] clinic. Shanda Bumps also will reach out to get him scheduled with Dr. Fran Lowes tomorrow  Thanks Viviano Simas FNP-C

## 2020-03-18 NOTE — Telephone Encounter (Signed)
Maxzide added to his BP list on 03-04-20. Pt. Was already on amlodipine  and ARB. Pt. states he is now getting lightheaded with this med.  BP running 83/60 on maxzide. Does not appear to have been on plain HCTZ before being on combination med.  BP running 140-150/90 before diuretic started.  Pt. Has not taken new diuretic medication today and is wondering what to do.

## 2020-03-18 NOTE — Progress Notes (Unsigned)
Called pt.

## 2020-03-19 ENCOUNTER — Other Ambulatory Visit: Payer: Self-pay

## 2020-03-19 ENCOUNTER — Ambulatory Visit: Payer: Self-pay | Admitting: Physician Assistant

## 2020-03-19 ENCOUNTER — Encounter: Payer: Self-pay | Admitting: Physician Assistant

## 2020-03-19 VITALS — BP 114/81 | HR 82 | Temp 98.3°F | Resp 14 | Ht 67.0 in | Wt 160.0 lb

## 2020-03-19 DIAGNOSIS — I10 Essential (primary) hypertension: Secondary | ICD-10-CM

## 2020-03-19 DIAGNOSIS — G4709 Other insomnia: Secondary | ICD-10-CM

## 2020-03-19 MED ORDER — TRIAZOLAM 0.25 MG PO TABS: 0.2500 mg | ORAL_TABLET | Freq: Every evening | ORAL | 0 refills | Status: DC | PRN

## 2020-03-19 MED ORDER — IRBESARTAN 300 MG PO TABS
300.0000 mg | ORAL_TABLET | Freq: Every day | ORAL | 3 refills | Status: DC
Start: 2020-03-19 — End: 2020-06-23

## 2020-03-19 MED ORDER — POTASSIUM CHLORIDE CRYS ER 20 MEQ PO TBCR: 40.0000 meq | EXTENDED_RELEASE_TABLET | Freq: Every day | ORAL | 1 refills | Status: DC

## 2020-03-19 MED ORDER — IRBESARTAN 150 MG PO TABS: 150.0000 mg | ORAL_TABLET | Freq: Every day | ORAL | 3 refills | Status: DC

## 2020-03-19 NOTE — Telephone Encounter (Signed)
Pt. Was seen today for an office visit with the provider.

## 2020-03-19 NOTE — Progress Notes (Signed)
Contacted [facility] in [place] at the request of Durward Parcel, PA-C. D/C'd Arapro 150 mg. Confirmed receipt of new order for  Arapro 300 mg with the pharmacist.   AMD

## 2020-03-19 NOTE — Progress Notes (Signed)
   Subjective:    Patient ID: Logan Bruce, male    DOB: [birth year], 55 y.o.   MRN: [id number]  HPI 55 year old male longstanding history of hypertension developed dizziness and low blood pressure reports low blood pressure. was started on Maxide of Friday for persistent blood pressure elevation developed symptoms Tuesday denies palpitation chest pain but describes dizziness as significant and incapacitating Blood pressure taken at home resolved 100/60 office visit 11/81 today so I need to make sure with normal pulse.    Review of Systems Denies sxs other then weakness, unchanged from Friday visit   Sh abstaining from ETOH last 6d has noticed difference as craving Objective:   Physical Exam blood pressure initial today 131/97 at discharge 120/88  Heent [place]/at conjunctiva aniteric not injected perrla eom full  No flush facies Pulm  Clear to A,P Cardiac quiet chest no m, r, g Abd soft bs present boggy but 2cm below icm liver extr unremarkable Neuro romberg eyes open today improved    Assessment & Plan:  Dr. Fran Lowes exam/summary I plan to discontinue Maxide repeat electrolytes panel Avapro 150 daily maintain Norvasc at 10 mg and KCl 40 mg daily reevaluate clients in 7 days  Patient will need or patient requested a refill for Halcion

## 2020-03-20 ENCOUNTER — Other Ambulatory Visit: Payer: Self-pay

## 2020-03-20 VITALS — BP 120/86

## 2020-03-20 DIAGNOSIS — I10 Essential (primary) hypertension: Secondary | ICD-10-CM

## 2020-03-20 MED ORDER — ALPRAZOLAM 0.25 MG PO TABS: 0.2500 mg | ORAL_TABLET | Freq: Every day | ORAL | 0 refills | Status: DC

## 2020-03-21 LAB — BASIC METABOLIC PANEL
BUN/Creatinine Ratio: 12 (ref 9–20)
BUN: 13 mg/dL (ref 6–24)
CO2: 26 mmol/L (ref 20–29)
Calcium: 9.8 mg/dL (ref 8.7–10.2)
Chloride: 94 mmol/L — ABNORMAL LOW (ref 96–106)
Creatinine, Ser: 1.13 mg/dL (ref 0.76–1.27)
GFR calc Af Amer: 86 mL/min/{1.73_m2} (ref >59)
GFR calc non Af Amer: 74 mL/min/{1.73_m2} (ref >59)
Glucose: 103 mg/dL — ABNORMAL HIGH (ref 65–99)
Potassium: 4.5 mmol/L (ref 3.5–5.2)
Sodium: 133 mmol/L — ABNORMAL LOW (ref 134–144)

## 2020-03-25 ENCOUNTER — Other Ambulatory Visit: Payer: Self-pay

## 2020-03-25 DIAGNOSIS — F909 Attention-deficit hyperactivity disorder, unspecified type: Secondary | ICD-10-CM

## 2020-03-25 MED ORDER — AMPHETAMINE-DEXTROAMPHET ER 20 MG PO CP24: 20.0000 mg | ORAL_CAPSULE | ORAL | 0 refills | Status: DC

## 2020-04-06 ENCOUNTER — Ambulatory Visit (INDEPENDENT_AMBULATORY_CARE_PROVIDER_SITE_OTHER): Payer: 59 | Admitting: Internal Medicine

## 2020-04-06 VITALS — BP 138/100 | HR 99 | Temp 99.7°F | Resp 14 | Ht 67.0 in | Wt 167.0 lb

## 2020-04-06 DIAGNOSIS — K219 Gastro-esophageal reflux disease without esophagitis: Secondary | ICD-10-CM | POA: Diagnosis not present

## 2020-04-06 DIAGNOSIS — I10 Essential (primary) hypertension: Secondary | ICD-10-CM

## 2020-04-06 DIAGNOSIS — G4719 Other hypersomnia: Secondary | ICD-10-CM

## 2020-04-06 DIAGNOSIS — G4709 Other insomnia: Secondary | ICD-10-CM

## 2020-04-06 NOTE — Progress Notes (Signed)
Sleep Medicine   Office Visit  Patient Name: Logan Bruce DOB: [birth year] MRN [id number]    Chief Complaint: difficulty sleeping  Brief History:  Treylon presents with a long history of difficulty initiating sleep. He has been on a number of different medications for this. His complaint is that they would all make him drowsy in the morning. He had a sleep study 12-15 years ago and was told he does not have sleep apnea. The problem began after he was married and had children.  He goes to bed between 11 p.m. and midnight but is not always sleepy despite medication. He watches TV in bed for 30 min before trying to sleep. Generally he will sleep well after that that but sometimes he has nights when he is very restless and has to take another sleep aid. He is currently taking triazolam. He feels that this is the best medication that he has been taking but it is becoming somewhat less effective.He gets up at 5:45 a.m. on weekends he can sleep until 8 a.m.  He feels rested when he wakes. His wife has told him that he snores but no apnea. He rocks his foot in bed but denies RLS symptoms.   The Epworth Sleepiness Score is 0 out of 24 .      ROS  General: (-) fever, (-) chills, (-) night sweat Nose and Sinuses: (-) nasal stuffiness or itchiness, (-) postnasal drip, (-) nosebleeds, (-) sinus trouble. Mouth and Throat: (-) sore throat, (-) hoarseness. Neck: (-) swollen glands, (-) enlarged thyroid, (-) neck pain. Respiratory: - cough, - shortness of breath, - wheezing. Neurologic: - numbness, - tingling. Psychiatric: - anxiety, - depression Sleep behavior: -sleep paralysis -hypnogogic hallucinations -dream enactment      -vivid dreams -cataplexy -night terrors -sleep walking   Current Medication: Outpatient Encounter Medications as of 04/06/2020  Medication Sig  . ALPRAZolam (XANAX) 0.25 MG tablet Take 1 tablet (0.25 mg total) by mouth at bedtime.  Marland Kitchen amLODipine (NORVASC) 10 MG tablet Take 1  tablet (10 mg total) by mouth daily.  Marland Kitchen amphetamine-dextroamphetamine (ADDERALL XR) 20 MG 24 hr capsule Take 1 capsule (20 mg total) by mouth every morning.  . irbesartan (AVAPRO) 300 MG tablet Take 1 tablet (300 mg total) by mouth daily. Have patient monitor bp, bring all reading to next visit  . omeprazole (PRILOSEC) 20 MG capsule Take 1 capsule (20 mg total) by mouth daily.  . potassium chloride SA (KLOR-CON) 20 MEQ tablet Take 2 tablets (40 mEq total) by mouth daily. Take with apple juice  . triazolam (HALCION) 0.25 MG tablet Take 1 tablet (0.25 mg total) by mouth at bedtime as needed (sleep; Take 1.5 by mouth daily).  . triazolam (HALCION) 0.25 MG tablet Take 1 tablet (0.25 mg total) by mouth at bedtime as needed for sleep.  . triazolam (HALCION) 0.25 MG tablet Take 1 tablet (0.25 mg total) by mouth at bedtime as needed for sleep.   No facility-administered encounter medications on file as of 04/06/2020.    Surgical History: Past Surgical History:  Procedure Laterality Date  . KNEE SURGERY Left    x3 reconstruction-[facility]    Medical History: Past Medical History:  Diagnosis Date  . Diverticulitis   . Hypertension     Family History: Non contributory to the present illness  Social History: Social History   Socioeconomic History  . Marital status: Married    Spouse name: Not on file  . Number of children: 2  .  Years of education: Not on file  . Highest education level: Not on file  Occupational History  . Not on file  Tobacco Use  . Smoking status: Never Smoker  . Smokeless tobacco: Current User    Types: Snuff  Vaping Use  . Vaping Use: Never used  Substance and Sexual Activity  . Alcohol use: No  . Drug use: No  . Sexual activity: Not on file  Other Topics Concern  . Not on file  Social History Narrative  . Not on file   Social Determinants of Health   Financial Resource Strain: Not on file  Food Insecurity: Not on file   Transportation Needs: Not on file  Physical Activity: Not on file  Stress: Not on file  Social Connections: Not on file  Intimate Partner Violence: Not on file    Vital Signs: Blood pressure (!) 138/100, pulse 99, temperature 99.7 F (37.6 C), temperature source Temporal, resp. rate 14, height $RemoveBe'5\' 7"'rWovZqSEg$  (1.702 m), weight 167 lb (75.8 kg), SpO2 97 %.  Examination: General Appearance: The patient is well-developed, well-nourished, and in no distress.  Neck Circumference: 42 Skin: Gross inspection of skin unremarkable. Head: normocephalic, no gross deformities. Eyes: no gross deformities noted. ENT: ears appear grossly normal Neurologic: Alert and oriented. No involuntary movements.    EPWORTH SLEEPINESS SCALE:  Scale:  (0)= no chance of dozing; (1)= slight chance of dozing; (2)= moderate chance of dozing; (3)= high chance of dozing  Chance  Situtation    Sitting and reading: 0    Watching TV: 0    Sitting Inactive in public: 0    As a passenger in car: 0      Lying down to rest: 0    Sitting and talking: 0    Sitting quielty after lunch: 0    In a car, stopped in traffic: 0   TOTAL SCORE:   0 out of 24   LABS: Recent Results (from the past 2160 hour(s))  CMP12+LP+TP+TSH+6AC+PSA+CBC.     Status: Abnormal   Collection Time: 02/25/20  8:45 AM  Result Value Ref Range   Glucose 101 (H) 65 - 99 mg/dL   Uric Acid 6.3 3.8 - 8.4 mg/dL    Comment:            Therapeutic target for gout patients: <6.0   BUN 9 6 - 24 mg/dL   Creatinine, Ser 1.03 0.76 - 1.27 mg/dL   GFR calc non Af Amer 83 >59 mL/min/1.73   GFR calc Af Amer 96 >59 mL/min/1.73    Comment: **In accordance with recommendations from the NKF-ASN Task force,**   [facility] is in the process of updating its eGFR calculation to the   2021 CKD-EPI creatinine equation that estimates kidney function   without a race variable.    BUN/Creatinine Ratio 9 9 - 20   Sodium 140 134 - 144 mmol/L   Potassium 5.0 3.5 - 5.2  mmol/L   Chloride 100 96 - 106 mmol/L   Calcium 9.1 8.7 - 10.2 mg/dL   Phosphorus 3.6 2.8 - 4.1 mg/dL   Total Protein 6.5 6.0 - 8.5 g/dL   Albumin 4.4 3.8 - 4.9 g/dL   Globulin, Total 2.1 1.5 - 4.5 g/dL   Albumin/Globulin Ratio 2.1 1.2 - 2.2   Bilirubin Total 0.3 0.0 - 1.2 mg/dL   Alkaline Phosphatase 68 44 - 121 IU/L    Comment:               **Please  note reference interval change**   LDH 201 121 - 224 IU/L   AST 28 0 - 40 IU/L   ALT 27 0 - 44 IU/L   GGT 20 0 - 65 IU/L   Iron 83 38 - 169 ug/dL   Cholesterol, Total 910 100 - 199 mg/dL   Triglycerides 86 0 - 149 mg/dL   HDL 65 >58 mg/dL   VLDL Cholesterol Cal 16 5 - 40 mg/dL   LDL Chol Calc (NIH) 610 (H) 0 - 99 mg/dL   Chol/HDL Ratio 2.9 0.0 - 5.0 ratio    Comment:                                   T. Chol/HDL Ratio                                             Men  Women                               1/2 Avg.Risk  3.4    3.3                                   Avg.Risk  5.0    4.4                                2X Avg.Risk  9.6    7.1                                3X Avg.Risk 23.4   11.0    Estimated CHD Risk  < 0.5 0.0 - 1.0 times avg.    Comment: The CHD Risk is based on the T. Chol/HDL ratio. Other factors affect CHD Risk such as hypertension, smoking, diabetes, severe obesity, and family history of premature CHD.    TSH 1.790 0.450 - 4.500 uIU/mL   T4, Total 5.5 4.5 - 12.0 ug/dL   T3 Uptake Ratio 30 24 - 39 %   Free Thyroxine Index 1.7 1.2 - 4.9   Prostate Specific Ag, Serum 1.3 0.0 - 4.0 ng/mL    Comment: Roche ECLIA methodology. According to the American Urological Association, Serum PSA should decrease and remain at undetectable levels after radical prostatectomy. The AUA defines biochemical recurrence as an initial PSA value 0.2 ng/mL or greater followed by a subsequent confirmatory PSA value 0.2 ng/mL or greater. Values obtained with different assay methods or kits cannot be used interchangeably. Results cannot  be interpreted as absolute evidence of the presence or absence of malignant disease.    WBC 3.6 3.4 - 10.8 x10E3/uL   RBC 4.64 4.14 - 5.80 x10E6/uL   Hemoglobin 15.0 13.0 - 17.7 g/dL   Hematocrit 04.2 90.6 - 51.0 %   MCV 94 79 - 97 fL   MCH 32.3 26.6 - 33.0 pg   MCHC 34.2 31.5 - 35.7 g/dL   RDW 99.1 39.2 - 19.2 %   Platelets 268 150 - 450 x10E3/uL   Neutrophils 58 Not Estab. %   Lymphs 27 Not Estab. %  Monocytes 11 Not Estab. %   Eos 3 Not Estab. %   Basos 1 Not Estab. %   Neutrophils Absolute 2.1 1.4 - 7.0 x10E3/uL   Lymphocytes Absolute 1.0 0.7 - 3.1 x10E3/uL   Monocytes Absolute 0.4 0.1 - 0.9 x10E3/uL   EOS (ABSOLUTE) 0.1 0.0 - 0.4 x10E3/uL   Basophils Absolute 0.0 0.0 - 0.2 x10E3/uL   Immature Granulocytes 0 Not Estab. %   Immature Grans (Abs) 0.0 0.0 - 0.1 x10E3/uL  POCT urinalysis dipstick     Status: Normal   Collection Time: 02/25/20  9:14 AM  Result Value Ref Range   Color, UA yellow    Clarity, UA clear    Glucose, UA Negative Negative   Bilirubin, UA negative    Ketones, UA negative    Spec Grav, UA 1.025 1.010 - 1.025   Blood, UA negative    pH, UA 6.0 5.0 - 8.0   Protein, UA Negative Negative   Urobilinogen, UA 0.2 0.2 or 1.0 E.U./dL   Nitrite, UA negative    Leukocytes, UA Negative Negative   Appearance medium    Odor    Basic metabolic panel     Status: Abnormal   Collection Time: 03/20/20 11:22 AM  Result Value Ref Range   Glucose 103 (H) 65 - 99 mg/dL   BUN 13 6 - 24 mg/dL   Creatinine, Ser 6.04 0.76 - 1.27 mg/dL   GFR calc non Af Amer 74 >59 mL/min/1.73   GFR calc Af Amer 86 >59 mL/min/1.73    Comment: **In accordance with recommendations from the NKF-ASN Task force,**   [facility] is in the process of updating its eGFR calculation to the   2021 CKD-EPI creatinine equation that estimates kidney function   without a race variable.    BUN/Creatinine Ratio 12 9 - 20   Sodium 133 (L) 134 - 144 mmol/L   Potassium 4.5 3.5 - 5.2 mmol/L   Chloride 94  (L) 96 - 106 mmol/L   CO2 26 20 - 29 mmol/L   Calcium 9.8 8.7 - 10.2 mg/dL    Radiology: No results found.  No results found.  No results found.    Assessment and Plan: Patient Active Problem List   Diagnosis Date Noted  . Essential hypertension 06/10/2019  . Attention deficit hyperactivity disorder (ADHD) 06/10/2019  . Other insomnia 06/10/2019  . Gastroesophageal reflux disease without esophagitis 06/10/2019  . Tendinitis of thumb 06/10/2019  . Mixed hyperlipidemia 06/10/2019  . History of prediabetes 06/10/2019     PLAN  The patient reports a long history of sleep onset insomnia. He was advised to enroll in CBTi and was given information on this. He does have a history of snoring, disrupted sleep and possible limb movements and a PSG is recommended to rule out this disorders.    1. OSA-a PSG will be ordered He is to take his sleep aid for the study. 2. Obesity Counseling: Had a lengthy discussion regarding patients BMI and weight issues. Patient was instructed on portion control as well as increased activity. Also discussed caloric restrictions with trying to maintain intake less than 2000 Kcal. Discussions were made in accordance with the 5As of weight management. Simple actions such as not eating late and if able to, taking a walk is suggested. 3. HTN will need to follow up with PCP 4. GERD PPI as needed  General Counseling: I have discussed the findings of the evaluation and examination with Onalee Hua.  I have also discussed any  further diagnostic evaluation thatmay be needed or ordered today. Shanon Brow verbalizes understanding of the findings of todays visit. We also reviewed his medications today and discussed drug interactions and side effects including but not limited excessive drowsiness and altered mental states. We also discussed that there is always a risk not just to him but also people around him. he has been encouraged to call the office with any questions or concerns  that should arise related to todays visit.  No orders of the defined types were placed in this encounter.       I have personally obtained a history, evaluated the patient, evaluated pertinent data, formulated the assessment and plan and placed orders.  This patient was seen today by Tressie Ellis, PA-C in collaboration with Dr. Devona Konig.    Richelle Ito Saunders Glance, PhD, FAASM  Diplomate, American Board of Sleep Medicine    Allyne Gee, MD [facility] Diplomate ABMS Pulmonary and Critical Care Medicine Sleep medicine

## 2020-04-10 ENCOUNTER — Other Ambulatory Visit: Payer: Self-pay

## 2020-04-10 DIAGNOSIS — Z1152 Encounter for screening for COVID-19: Secondary | ICD-10-CM

## 2020-04-10 NOTE — Progress Notes (Signed)
Pt presents today to complete PCR Covid test due symptoms that started  04/08/20. Pt is having congestion, cough, green mucous and bloody nose (Sinus). Pt denies fever but possibly had one Monday 04/06/20 99.7. CL,RMA

## 2020-04-11 ENCOUNTER — Other Ambulatory Visit: Payer: Self-pay | Admitting: Physician Assistant

## 2020-04-12 LAB — SARS-COV-2, NAA 2 DAY TAT

## 2020-04-12 LAB — NOVEL CORONAVIRUS, NAA: SARS-CoV-2, NAA: NOT DETECTED

## 2020-04-14 ENCOUNTER — Other Ambulatory Visit: Payer: Self-pay

## 2020-04-14 ENCOUNTER — Encounter: Payer: Self-pay | Admitting: Adult Medicine

## 2020-04-14 ENCOUNTER — Ambulatory Visit: Payer: Self-pay | Admitting: Adult Medicine

## 2020-04-14 DIAGNOSIS — G4709 Other insomnia: Secondary | ICD-10-CM

## 2020-04-14 MED ORDER — TRIAZOLAM 0.25 MG PO TABS: 0.2500 mg | ORAL_TABLET | Freq: Every evening | ORAL | 0 refills | Status: DC | PRN

## 2020-04-17 NOTE — Progress Notes (Signed)
Subjective:    Patient ID: Logan Bruce, male    DOB: 1965/12/05, 55 y.o.   MRN: [id number]  HPI  Presents today to review testing. Reports since stopping etoh x3-4wk notice increased fatigue,  Agitation resolved    Review of Systems     Objective:   Physical Exam  Elevated bp though improved o2 sat 95    Results for JAICOB, DIA" (MRN [id number]) as of 04/17/2020 16:01 02/25/2020 08:45  03/04/2020 11:21 03/20/2020 11:22 04/10/2020 13:46     Rpt (A)   140   133 (L)   5.0   4.5   100   94 (L)      26   101 (H)   103 (H)   9   13   1.03   1.13   9.1   9.8   9   12    3.6      68      4.4      2.1      6.3      28      27       6.5      0.3      20      83   74   96   86   < 0.5      201      2.9      186      65      86      16      105 (H)      83      2.1      3.6      4.64      15.0      43.8      94      32.3      34.2      11.7      268      58      0      2.1      1.0      0.4      0.0      0.0      27      11      1      3       0.1      1.790      5.5      1.7      30      1.3          Rpt      Performed   medium      negative      clear      yellow      Negative      negative      Negative      negative      6.0      Negative      1.025      0.2         Assessment & Plan:  Lassitude may be due to added potassium will discontinue   Avapro 300mg  will change to bid 150mg  and begin transition to non Acei/Arb maintain norvasc  Age 3 adderall 20mg  xr in place since age 74's-30's Halcion .25 in place for interrupted sleep, snores but reports 7-10y past sleep test neg Xanax  .25mg  qhs mind 'does not shut off'  Discussed eap  services client will review and decide later

## 2020-04-20 ENCOUNTER — Telehealth: Payer: Self-pay

## 2020-04-20 NOTE — Telephone Encounter (Signed)
Dr. Pricilla Handler wants Logan Bruce to split his Avapro (Irbesartan) tablets in two pieces & take 1/2 tab (150 mg) at 8:00 am & 1/2 tab (150 mg) at 8:00 pm.  Tried to call Logan Bruce on cell number & went straight to voice mail.  Left a message for him to call me back.  AMD

## 2020-04-21 DIAGNOSIS — G4730 Sleep apnea, unspecified: Secondary | ICD-10-CM | POA: Diagnosis not present

## 2020-04-21 NOTE — Telephone Encounter (Signed)
Tried to call Nida Boatman (2nd time).  Went straight to voice mail.  Left call back message.  AMD

## 2020-04-22 ENCOUNTER — Other Ambulatory Visit: Payer: Self-pay

## 2020-04-22 DIAGNOSIS — G4709 Other insomnia: Secondary | ICD-10-CM

## 2020-04-22 DIAGNOSIS — F909 Attention-deficit hyperactivity disorder, unspecified type: Secondary | ICD-10-CM

## 2020-04-22 MED ORDER — TRIAZOLAM 0.25 MG PO TABS: 0.2500 mg | ORAL_TABLET | Freq: Every evening | ORAL | 0 refills | Status: DC | PRN

## 2020-04-22 MED ORDER — AMPHETAMINE-DEXTROAMPHET ER 20 MG PO CP24: 20.0000 mg | ORAL_CAPSULE | ORAL | 0 refills | Status: DC

## 2020-04-22 NOTE — Telephone Encounter (Signed)
Spoke with Logan Bruce today.  Informed of Dr. Fran Lowes wanting him to split Avapro in half - take 1/2 tab po at 8:00 am & 1/2 tab po at 8:00 pm. Verbalized understanding.  AMD

## 2020-04-24 ENCOUNTER — Other Ambulatory Visit: Payer: Self-pay | Admitting: Adult Medicine

## 2020-04-24 DIAGNOSIS — F901 Attention-deficit hyperactivity disorder, predominantly hyperactive type: Secondary | ICD-10-CM

## 2020-04-24 DIAGNOSIS — G4709 Other insomnia: Secondary | ICD-10-CM

## 2020-04-24 MED ORDER — AMPHETAMINE-DEXTROAMPHETAMINE 20 MG PO TABS: 20.0000 mg | ORAL_TABLET | Freq: Every morning | ORAL | 0 refills | Status: DC

## 2020-04-24 MED ORDER — TRIAZOLAM 0.25 MG PO TABS: 0.2500 mg | ORAL_TABLET | Freq: Every evening | ORAL | 0 refills | Status: DC | PRN

## 2020-04-25 ENCOUNTER — Other Ambulatory Visit: Payer: Self-pay | Admitting: Physician Assistant

## 2020-04-25 DIAGNOSIS — G4709 Other insomnia: Secondary | ICD-10-CM

## 2020-04-27 ENCOUNTER — Other Ambulatory Visit: Payer: Self-pay | Admitting: Adult Medicine

## 2020-04-27 DIAGNOSIS — G4709 Other insomnia: Secondary | ICD-10-CM

## 2020-04-27 MED ORDER — TRIAZOLAM 0.25 MG PO TABS: 0.2500 mg | ORAL_TABLET | Freq: Every evening | ORAL | 0 refills | Status: DC | PRN

## 2020-05-19 ENCOUNTER — Telehealth: Payer: Self-pay

## 2020-05-19 NOTE — Telephone Encounter (Signed)
Received email from [facility] [employer] [phone]) informing us that Eathon was No Show for sleep study scheduled for 05/06/2020.  AMD

## 2020-05-25 ENCOUNTER — Other Ambulatory Visit: Payer: Self-pay

## 2020-05-25 DIAGNOSIS — F901 Attention-deficit hyperactivity disorder, predominantly hyperactive type: Secondary | ICD-10-CM

## 2020-05-25 MED ORDER — AMPHETAMINE-DEXTROAMPHETAMINE 20 MG PO TABS: 20.0000 mg | ORAL_TABLET | Freq: Every morning | ORAL | 0 refills | Status: DC

## 2020-05-27 ENCOUNTER — Other Ambulatory Visit: Payer: Self-pay | Admitting: Physician Assistant

## 2020-05-27 DIAGNOSIS — Z0130 Encounter for examination of blood pressure without abnormal findings: Secondary | ICD-10-CM

## 2020-06-01 ENCOUNTER — Telehealth: Payer: Self-pay

## 2020-06-01 NOTE — Telephone Encounter (Signed)
Logan Bruce called to report experiencing sharp pains on the left side of his chest after conducting heavy lifting / wood chopping most of the day Saturday 3.19.22.  Pain is sharp, quick and intermittent brought on by coughing, deep breathing or repositioning onto his left side while laying down. [name] states pain is not always present with these activities. "I don't know if it's gas or what it might be". He denies experiencing any SOB, Arm/Neck or jaw pain,  LOC, dizziness or irregular heart rhythms during these episodes. Pain does not radiate anywhere else beyond L anterior chest wall at level of xyphoid process. Would like to schedule with provider tomorrow. He has been advised if pain should radiate or accompany with SOB/ chest pressure, dizziness, etc he needs to call 911.

## 2020-06-05 ENCOUNTER — Telehealth: Payer: Self-pay | Admitting: General Practice

## 2020-06-05 NOTE — Telephone Encounter (Signed)
Spoke with Pt about following up with the clinic for complaint of chest/side pain. PT stated that he was feeling much better and the pain must have been a pulled muscle. Declined to come in at this time.

## 2020-06-23 ENCOUNTER — Other Ambulatory Visit: Payer: Self-pay

## 2020-06-23 DIAGNOSIS — I10 Essential (primary) hypertension: Secondary | ICD-10-CM

## 2020-06-23 DIAGNOSIS — F901 Attention-deficit hyperactivity disorder, predominantly hyperactive type: Secondary | ICD-10-CM

## 2020-06-23 DIAGNOSIS — G4709 Other insomnia: Secondary | ICD-10-CM

## 2020-06-23 MED ORDER — IRBESARTAN 300 MG PO TABS
300.0000 mg | ORAL_TABLET | Freq: Every day | ORAL | 1 refills | Status: DC
Start: 2020-06-23 — End: 2020-09-15

## 2020-06-23 MED ORDER — TRIAZOLAM 0.25 MG PO TABS
0.2500 mg | ORAL_TABLET | Freq: Every evening | ORAL | 1 refills | Status: DC | PRN
Start: 2020-06-23 — End: 2020-07-13

## 2020-06-23 MED ORDER — AMPHETAMINE-DEXTROAMPHETAMINE 20 MG PO TABS: 20.0000 mg | ORAL_TABLET | Freq: Every morning | ORAL | 0 refills | Status: DC

## 2020-06-29 DIAGNOSIS — H524 Presbyopia: Secondary | ICD-10-CM | POA: Diagnosis not present

## 2020-07-13 ENCOUNTER — Other Ambulatory Visit: Payer: Self-pay | Admitting: Physician Assistant

## 2020-07-13 ENCOUNTER — Other Ambulatory Visit: Payer: Self-pay

## 2020-07-13 DIAGNOSIS — G4709 Other insomnia: Secondary | ICD-10-CM

## 2020-07-13 MED ORDER — TRIAZOLAM 0.25 MG PO TABS: 0.2500 mg | ORAL_TABLET | Freq: Every evening | ORAL | 1 refills | Status: DC | PRN

## 2020-07-13 NOTE — Telephone Encounter (Signed)
Erroneous entry

## 2020-07-27 ENCOUNTER — Other Ambulatory Visit: Payer: Self-pay

## 2020-07-27 DIAGNOSIS — F901 Attention-deficit hyperactivity disorder, predominantly hyperactive type: Secondary | ICD-10-CM

## 2020-07-27 MED ORDER — AMPHETAMINE-DEXTROAMPHETAMINE 20 MG PO TABS: 20.0000 mg | ORAL_TABLET | Freq: Every morning | ORAL | 0 refills | Status: DC

## 2020-08-11 ENCOUNTER — Other Ambulatory Visit: Payer: Self-pay

## 2020-08-11 DIAGNOSIS — G4709 Other insomnia: Secondary | ICD-10-CM

## 2020-08-11 MED ORDER — TRIAZOLAM 0.25 MG PO TABS: 0.2500 mg | ORAL_TABLET | Freq: Every evening | ORAL | 1 refills | Status: DC | PRN

## 2020-08-26 ENCOUNTER — Other Ambulatory Visit: Payer: Self-pay

## 2020-08-26 DIAGNOSIS — F901 Attention-deficit hyperactivity disorder, predominantly hyperactive type: Secondary | ICD-10-CM

## 2020-08-26 NOTE — Telephone Encounter (Signed)
[name] called requesting Rx refill for Adderall.  Last office visit with refill was his physical which was 03/03/20.  Reviewed refill requests in Epic & this is the 7th request since physical.  Due for office visit with the provider to continue refills for Adderall.  Left voice mail at [phone] for [name] to call the office to schedule an appointment.  AMD

## 2020-08-28 ENCOUNTER — Ambulatory Visit: Payer: Self-pay | Admitting: Adult Health

## 2020-08-28 ENCOUNTER — Other Ambulatory Visit: Payer: Self-pay

## 2020-08-28 ENCOUNTER — Encounter: Payer: Self-pay | Admitting: Adult Health

## 2020-08-28 VITALS — BP 133/97 | HR 85 | Temp 97.8°F | Resp 14 | Ht 67.0 in | Wt 166.0 lb

## 2020-08-28 DIAGNOSIS — Z9189 Other specified personal risk factors, not elsewhere classified: Secondary | ICD-10-CM

## 2020-08-28 DIAGNOSIS — F901 Attention-deficit hyperactivity disorder, predominantly hyperactive type: Secondary | ICD-10-CM

## 2020-08-28 MED ORDER — AMPHETAMINE-DEXTROAMPHETAMINE 20 MG PO TABS: 20.0000 mg | ORAL_TABLET | Freq: Every morning | ORAL | 0 refills | Status: DC

## 2020-08-28 NOTE — Progress Notes (Signed)
[facility] [address], [place] [address]   Office Visit Note  Patient Name: Logan Bruce  [id number]  [id number]  Date of Service: 08/28/2020  Chief Complaint  Patient presents with   Medication Refill     HPI Pt is here for refill on Adderall.  He reports good control of symptoms on current does.  Denies any side effects. His diastolic BP is elevated.  He reports this is normal, and he is on two BP medications. No dizziness, chest pain, sob or other complaints. He has been on Adderall for more than 10 years.       Current Medication:  Outpatient Encounter Medications as of 08/28/2020  Medication Sig   ALPRAZolam (XANAX) 0.25 MG tablet Take 1 tablet (0.25 mg total) by mouth at bedtime.   amLODipine (NORVASC) 10 MG tablet Take 1 tablet (10 mg total) by mouth daily.   irbesartan (AVAPRO) 300 MG tablet Take 1 tablet (300 mg total) by mouth daily. Have patient monitor bp, bring all reading to next visit   omeprazole (PRILOSEC) 20 MG capsule Take 1 capsule (20 mg total) by mouth daily.   potassium chloride SA (KLOR-CON) 20 MEQ tablet Take 2 tablets (40 mEq total) by mouth daily. Take with apple juice   triazolam (HALCION) 0.25 MG tablet Take 1 tablet (0.25 mg total) by mouth at bedtime as needed for sleep.   [DISCONTINUED] amphetamine-dextroamphetamine (ADDERALL) 20 MG tablet Take 1 tablet (20 mg total) by mouth in the morning.   amphetamine-dextroamphetamine (ADDERALL) 20 MG tablet Take 1 tablet (20 mg total) by mouth in the morning.   No facility-administered encounter medications on file as of 08/28/2020.      Medical History: Past Medical History:  Diagnosis Date   Diverticulitis    Hypertension      Vital Signs: BP (!) 133/97   Pulse 85   Temp 97.8 F (36.6 C)   Resp 14   Ht 5\' 7"  (1.702 m)   Wt 166 lb (75.3 kg)   SpO2 94%   BMI 26.00 kg/m    Review of Systems  Constitutional:  Negative for activity change, appetite change and fatigue.   HENT:  Negative for congestion, sinus pain, trouble swallowing and voice change.   Eyes:  Negative for pain, discharge and visual disturbance.  Respiratory:  Negative for cough, chest tightness and shortness of breath.   Cardiovascular:  Negative for chest pain and leg swelling.  Gastrointestinal:  Negative for abdominal distention, abdominal pain, constipation and diarrhea.  Genitourinary:  Negative for flank pain.  Musculoskeletal:  Negative for arthralgias, back pain and neck pain.  Skin:  Negative for color change.  Neurological:  Negative for dizziness, weakness and headaches.  Hematological:  Negative for adenopathy.  Psychiatric/Behavioral:  Negative for agitation, confusion and suicidal ideas.    Physical Exam Constitutional:      Appearance: He is normal weight.  HENT:     Head: Normocephalic.  Cardiovascular:     Rate and Rhythm: Normal rate.     Pulses: Normal pulses.     Heart sounds: No murmur heard.   No friction rub. No gallop.  Pulmonary:     Effort: Pulmonary effort is normal.     Breath sounds: Normal breath sounds.  Neurological:     General: No focal deficit present.     Mental Status: He is alert and oriented to person, place, and time. Mental status is at baseline.  Psychiatric:  Mood and Affect: Mood normal.        Behavior: Behavior normal.        Thought Content: Thought content normal.        Judgment: Judgment normal.      Assessment/Plan: 1. Attention deficit hyperactivity disorder (ADHD), predominantly hyperactive type Refilled Controlled medication today. Reviewed risks and possible side effects associated with taking Stimulants. Combination of these drugs with other psychotropic medications could cause dizziness and drowsiness. Pt needs to Monitor symptoms and exercise caution in driving and operating heavy machinery to avoid damages to oneself, to others and to the surroundings. Patient verbalized understanding in this matter. Dependence  and abuse for these drugs will be monitored closely. A Controlled substance policy and procedure is on file which allows [facility] to order a urine drug screen test at any visit. Patient understands and agrees with the plan..  - amphetamine-dextroamphetamine (ADDERALL) 20 MG tablet; Take 1 tablet (20 mg total) by mouth in the morning.  Dispense: 30 tablet; Refill: 0  2. At risk for polypharmacy Reviewed risks and possible side effects associated with taking opiates, stimulants, benzodiazepines and other CNS depressants. Combination of these could cause dizziness and drowsiness. Advised patient not to drive or operate machinery when taking these medications, as patient's and other's life can be at risk and will have consequences. Patient verbalized understanding in this matter. Dependence and abuse for these drugs will be monitored closely.        General Counseling: Logan Bruce understanding of the findings of todays visit and agrees with plan of treatment. I have discussed any further diagnostic evaluation that may be needed or ordered today. We also reviewed his medications today. he has been encouraged to call the office with any questions or concerns that should arise related to todays visit.   No orders of the defined types were placed in this encounter.   Meds ordered this encounter  Medications   amphetamine-dextroamphetamine (ADDERALL) 20 MG tablet    Sig: Take 1 tablet (20 mg total) by mouth in the morning.    Dispense:  30 tablet    Refill:  0    Time spent:30 Minutes    Logan Bruce AGNP-C Nurse Practitioner

## 2020-08-28 NOTE — Progress Notes (Signed)
Pt presents today needing refill on adderall  month follow up. CL,RMA

## 2020-09-15 ENCOUNTER — Other Ambulatory Visit: Payer: Self-pay

## 2020-09-15 DIAGNOSIS — I10 Essential (primary) hypertension: Secondary | ICD-10-CM

## 2020-09-15 DIAGNOSIS — G4709 Other insomnia: Secondary | ICD-10-CM

## 2020-09-15 DIAGNOSIS — K219 Gastro-esophageal reflux disease without esophagitis: Secondary | ICD-10-CM

## 2020-09-15 MED ORDER — TRIAZOLAM 0.25 MG PO TABS: 0.2500 mg | ORAL_TABLET | Freq: Every evening | ORAL | 1 refills | Status: DC | PRN

## 2020-09-15 MED ORDER — IRBESARTAN 300 MG PO TABS: 300.0000 mg | ORAL_TABLET | Freq: Every day | ORAL | 1 refills | Status: DC

## 2020-09-15 MED ORDER — OMEPRAZOLE 20 MG PO CPDR: 20.0000 mg | DELAYED_RELEASE_CAPSULE | Freq: Every day | ORAL | 1 refills | Status: DC

## 2020-09-15 MED ORDER — AMLODIPINE BESYLATE 10 MG PO TABS: 10.0000 mg | ORAL_TABLET | Freq: Every day | ORAL | 3 refills | Status: DC

## 2020-09-28 ENCOUNTER — Other Ambulatory Visit: Payer: Self-pay

## 2020-09-28 ENCOUNTER — Other Ambulatory Visit: Payer: Self-pay | Admitting: Physician Assistant

## 2020-09-28 DIAGNOSIS — F901 Attention-deficit hyperactivity disorder, predominantly hyperactive type: Secondary | ICD-10-CM

## 2020-09-28 DIAGNOSIS — G4709 Other insomnia: Secondary | ICD-10-CM

## 2020-09-28 MED ORDER — AMPHETAMINE-DEXTROAMPHETAMINE 20 MG PO TABS: 20.0000 mg | ORAL_TABLET | Freq: Every morning | ORAL | 0 refills | Status: DC

## 2020-10-12 ENCOUNTER — Other Ambulatory Visit: Payer: Self-pay

## 2020-10-12 DIAGNOSIS — G4709 Other insomnia: Secondary | ICD-10-CM

## 2020-10-12 MED ORDER — TRIAZOLAM 0.25 MG PO TABS: 0.2500 mg | ORAL_TABLET | Freq: Every evening | ORAL | 1 refills | Status: DC | PRN

## 2020-10-19 ENCOUNTER — Other Ambulatory Visit: Payer: Self-pay

## 2020-10-19 ENCOUNTER — Emergency Department
Admission: EM | Admit: 2020-10-19 | Discharge: 2020-10-19 | Disposition: A | Discharge status: Home / Self Care | Payer: 59 | Attending: Emergency Medicine | Admitting: Emergency Medicine

## 2020-10-19 ENCOUNTER — Telehealth: Payer: Self-pay | Admitting: Physician Assistant

## 2020-10-19 ENCOUNTER — Emergency Department: Payer: 59

## 2020-10-19 ENCOUNTER — Other Ambulatory Visit: Payer: Self-pay | Admitting: Physician Assistant

## 2020-10-19 ENCOUNTER — Encounter: Payer: Self-pay | Admitting: Intensive Care

## 2020-10-19 ENCOUNTER — Encounter: Payer: Self-pay | Admitting: Physician Assistant

## 2020-10-19 DIAGNOSIS — K219 Gastro-esophageal reflux disease without esophagitis: Secondary | ICD-10-CM | POA: Insufficient documentation

## 2020-10-19 DIAGNOSIS — Z79899 Other long term (current) drug therapy: Secondary | ICD-10-CM | POA: Insufficient documentation

## 2020-10-19 DIAGNOSIS — K829 Disease of gallbladder, unspecified: Secondary | ICD-10-CM | POA: Insufficient documentation

## 2020-10-19 DIAGNOSIS — R0602 Shortness of breath: Secondary | ICD-10-CM | POA: Diagnosis present

## 2020-10-19 DIAGNOSIS — R109 Unspecified abdominal pain: Secondary | ICD-10-CM | POA: Diagnosis not present

## 2020-10-19 DIAGNOSIS — R1011 Right upper quadrant pain: Secondary | ICD-10-CM | POA: Diagnosis not present

## 2020-10-19 DIAGNOSIS — I10 Essential (primary) hypertension: Secondary | ICD-10-CM | POA: Insufficient documentation

## 2020-10-19 DIAGNOSIS — K389 Disease of appendix, unspecified: Secondary | ICD-10-CM | POA: Insufficient documentation

## 2020-10-19 DIAGNOSIS — B349 Viral infection, unspecified: Secondary | ICD-10-CM | POA: Insufficient documentation

## 2020-10-19 DIAGNOSIS — K760 Fatty (change of) liver, not elsewhere classified: Secondary | ICD-10-CM | POA: Diagnosis not present

## 2020-10-19 DIAGNOSIS — F17200 Nicotine dependence, unspecified, uncomplicated: Secondary | ICD-10-CM | POA: Diagnosis not present

## 2020-10-19 DIAGNOSIS — R1010 Upper abdominal pain, unspecified: Secondary | ICD-10-CM

## 2020-10-19 DIAGNOSIS — Z1152 Encounter for screening for COVID-19: Secondary | ICD-10-CM

## 2020-10-19 DIAGNOSIS — R69 Illness, unspecified: Secondary | ICD-10-CM | POA: Diagnosis not present

## 2020-10-19 HISTORY — DX: Gastro-esophageal reflux disease without esophagitis: K21.9

## 2020-10-19 LAB — CBC
HCT: 50.4 % (ref 39.0–52.0)
Hemoglobin: 17.3 g/dL — ABNORMAL HIGH (ref 13.0–17.0)
MCH: 32.8 pg (ref 26.0–34.0)
MCHC: 34.3 g/dL (ref 30.0–36.0)
MCV: 95.5 fL (ref 80.0–100.0)
Platelets: 284 10*3/uL (ref 150–400)
RBC: 5.28 MIL/uL (ref 4.22–5.81)
RDW: 11.8 % (ref 11.5–15.5)
WBC: 6.5 10*3/uL (ref 4.0–10.5)
nRBC: 0 % (ref 0.0–0.2)

## 2020-10-19 LAB — POCT INFLUENZA A/B
Influenza A, POC: NEGATIVE
Influenza B, POC: NEGATIVE

## 2020-10-19 LAB — URINALYSIS, COMPLETE (UACMP) WITH MICROSCOPIC
Bacteria, UA: NONE SEEN
Bilirubin Urine: NEGATIVE
Glucose, UA: NEGATIVE mg/dL
Hgb urine dipstick: NEGATIVE
Ketones, ur: NEGATIVE mg/dL
Leukocytes,Ua: NEGATIVE
Nitrite: NEGATIVE
Protein, ur: NEGATIVE mg/dL
Specific Gravity, Urine: 1.017 (ref 1.005–1.030)
Squamous Epithelial / HPF: NONE SEEN (ref 0–5)
pH: 7 (ref 5.0–8.0)

## 2020-10-19 LAB — COMPREHENSIVE METABOLIC PANEL
ALT: 47 U/L — ABNORMAL HIGH (ref 0–44)
AST: 48 U/L — ABNORMAL HIGH (ref 15–41)
Albumin: 5 g/dL (ref 3.5–5.0)
Alkaline Phosphatase: 72 U/L (ref 38–126)
Anion gap: 8 (ref 5–15)
BUN: 12 mg/dL (ref 6–20)
CO2: 27 mmol/L (ref 22–32)
Calcium: 9.7 mg/dL (ref 8.9–10.3)
Chloride: 100 mmol/L (ref 98–111)
Creatinine, Ser: 1.09 mg/dL (ref 0.61–1.24)
GFR, Estimated: >60 mL/min (ref >60)
Glucose, Bld: 114 mg/dL — ABNORMAL HIGH (ref 70–99)
Potassium: 4.6 mmol/L (ref 3.5–5.1)
Sodium: 135 mmol/L (ref 135–145)
Total Bilirubin: 0.9 mg/dL (ref 0.3–1.2)
Total Protein: 8.2 g/dL — ABNORMAL HIGH (ref 6.5–8.1)

## 2020-10-19 LAB — LIPASE, BLOOD: Lipase: 46 U/L (ref 11–51)

## 2020-10-19 MED ORDER — SODIUM CHLORIDE 0.9 % IV BOLUS
1000.0000 mL | Freq: Once | INTRAVENOUS | Status: AC
  Administered 2020-10-19: 1000 mL via INTRAVENOUS

## 2020-10-19 MED ORDER — IOHEXOL 350 MG/ML SOLN
80.0000 mL | Freq: Once | INTRAVENOUS | Status: AC | PRN
  Administered 2020-10-19: 80 mL via INTRAVENOUS
  Filled 2020-10-19: qty 80

## 2020-10-19 MED ORDER — TRIAMCINOLONE ACETONIDE 40 MG/ML IJ SUSP
40.0000 mg | Freq: Once | INTRAMUSCULAR | Status: AC
  Administered 2020-10-19: 40 mg via INTRAMUSCULAR

## 2020-10-19 NOTE — ED Triage Notes (Signed)
Patient c/o sob, RUQ pain and nausea since Friday. Reports two negative covid tests over the weekend. Reports no energy.

## 2020-10-19 NOTE — Progress Notes (Signed)
Symptoms started early Thursday morning, runny nose, dry heaving, stomach pain, sweating profusely and extreme nausea and pt states the stomach pain is right under right side of ribs. Scant urine past 2-3 days.

## 2020-10-19 NOTE — Discharge Instructions (Addendum)
All of your labs, CT, and ultrasound are negative for any acute abnormality Please follow-up with your regular doctor for your COVID and flu test results. Return emergency department worsening Drink plenty of fluids

## 2020-10-19 NOTE — ED Provider Notes (Signed)
[facility] Emergency Department Provider Note  ____________________________________________   Event Date/Time   First MD Initiated Contact with Patient 10/19/20 1200     (approximate)  I have reviewed the triage vital signs and the nursing notes.   HISTORY  Chief Complaint Abdominal Pain and Shortness of Breath    HPI Logan Bruce is a 55 y.o. male presents emergency department complaining of vague abdominal pain along with sweats, nausea and vomiting.  Patient took 2 home COVID test which were negative.  He was tested for flu and COVID at [facility] today.  Told to come to the ED.  No recent tick bite.  Patient states he does get very nauseated after eating.  Still has his appendix and his gallbladder.  Past Medical History:  Diagnosis Date   Acid reflux    Diverticulitis    Hypertension     Patient Active Problem List   Diagnosis Date Noted   Essential hypertension 06/10/2019   Attention deficit hyperactivity disorder (ADHD) 06/10/2019   Other insomnia 06/10/2019   Gastroesophageal reflux disease without esophagitis 06/10/2019   Tendinitis of thumb 06/10/2019   Mixed hyperlipidemia 06/10/2019   History of prediabetes 06/10/2019    Past Surgical History:  Procedure Laterality Date   KNEE SURGERY Left    x3 reconstruction-[facility]    Prior to Admission medications   Medication Sig Start Date End Date Taking? Authorizing Provider  ALPRAZolam (XANAX) 0.25 MG tablet Take 1 tablet (0.25 mg total) by mouth at bedtime. 03/20/20   Joni Reining, PA-C  amLODipine (NORVASC) 10 MG tablet Take 1 tablet (10 mg total) by mouth daily. 09/15/20   Joni Reining, PA-C  amphetamine-dextroamphetamine (ADDERALL) 20 MG tablet Take 1 tablet (20 mg total) by mouth in the morning. 09/28/20   Joni Reining, PA-C  irbesartan (AVAPRO) 300 MG tablet Take 1 tablet (300 mg total) by mouth daily. Have patient monitor bp, bring all  reading to next visit 09/15/20   Joni Reining, PA-C  omeprazole (PRILOSEC) 20 MG capsule Take 1 capsule (20 mg total) by mouth daily. 09/15/20   Joni Reining, PA-C  potassium chloride SA (KLOR-CON) 20 MEQ tablet Take 2 tablets (40 mEq total) by mouth daily. Take with apple juice 03/19/20   Joni Reining, PA-C  triazolam (HALCION) 0.25 MG tablet Take 1 tablet (0.25 mg total) by mouth at bedtime as needed for sleep. 10/12/20   Joni Reining, PA-C    Allergies Patient has no known allergies.  Family History  Problem Relation Age of Onset   Cancer Father     Social History Social History   Tobacco Use   Smoking status: Never   Smokeless tobacco: Current    Types: Snuff  Vaping Use   Vaping Use: Never used  Substance Use Topics   Alcohol use: Yes    Alcohol/week: 42.0 standard drinks    Types: 42 Cans of beer per week   Drug use: No    Review of Systems  Constitutional: Positive fever/chills Eyes: No visual changes. ENT: No sore throat. Respiratory: Denies cough Cardiovascular: Denies chest pain Gastrointestinal: Positive abdominal pain, some vomiting, denies diarrhea or dark stools Genitourinary: Negative for dysuria. Musculoskeletal: Negative for back pain. Skin: Negative for rash. Psychiatric: no mood changes,     ____________________________________________   PHYSICAL EXAM:  VITAL SIGNS: ED Triage Vitals  Enc Vitals Group     BP 10/19/20 1104 (!) 140/103  Pulse Rate 10/19/20 1104 98     Resp 10/19/20 1104 18     Temp 10/19/20 1104 97.9 F (36.6 C)     Temp Source 10/19/20 1104 Oral     SpO2 10/19/20 1104 99 %     Weight 10/19/20 1113 165 lb (74.8 kg)     Height 10/19/20 1113 5\' 7"  (1.702 m)     Head Circumference --      Peak Flow --      Pain Score 10/19/20 1113 6     Pain Loc --      Pain Edu? --      Excl. in GC? --     Constitutional: Alert and oriented. Well appearing and in no acute distress. Eyes: Conjunctivae are normal.  Head:  Atraumatic. Nose: No congestion/rhinnorhea. Mouth/Throat: Mucous membranes are moist.   Neck:  supple no lymphadenopathy noted Cardiovascular: Normal rate, regular rhythm. Heart sounds are normal Respiratory: Normal respiratory effort.  No retractions, lungs c t a  Abd: soft tender in the left and right lower quadrant, more pronounced on the right, bs normal all 4 quad GU: deferred Musculoskeletal: FROM all extremities, warm and well perfused Neurologic:  Normal speech and language.  Skin:  Skin is warm, dry and intact. No rash noted. Psychiatric: Mood and affect are normal. Speech and behavior are normal.  ____________________________________________   LABS (all labs ordered are listed, but only abnormal results are displayed)  Labs Reviewed  COMPREHENSIVE METABOLIC PANEL - Abnormal; Notable for the following components:      Result Value   Glucose, Bld 114 (*)    Total Protein 8.2 (*)    AST 48 (*)    ALT 47 (*)    All other components within normal limits  CBC - Abnormal; Notable for the following components:   Hemoglobin 17.3 (*)    All other components within normal limits  URINALYSIS, COMPLETE (UACMP) WITH MICROSCOPIC - Abnormal; Notable for the following components:   Color, Urine AMBER (*)    APPearance HAZY (*)    All other components within normal limits  LIPASE, BLOOD   ____________________________________________   ____________________________________________  RADIOLOGY  CT abdomen/pelvis IV contrast  ____________________________________________   PROCEDURES  Procedure(s) performed: No  Procedures    ____________________________________________   INITIAL IMPRESSION / ASSESSMENT AND PLAN / ED COURSE  Pertinent labs & imaging results that were available during my care of the patient were reviewed by me and considered in my medical decision making (see chart for details).   Patient's 55 year old male presents emergency department with abdominal  pain.  See HPI.  Physical exam shows patient per stable  DDx: Acute cholecystitis, acute pancreatitis, acute appendicitis, covid, UTI, prostatitis  Labs are reassuring, CBC is normal, metabolic panel has minimal elevation of AST and ALT, lipase is normal, urinalysis is normal  EKG shows nsr, see physician read  CT abdomen/pelvis IV contrast    Ct reviewed by me and confirmed by radiology to be negative for any acute abnormality  40 reviewed by me and confirmed by radiology to negative  Explained the findings to the patient, states he feels better after fluids, is to f/u with CBP, secure message sent to his provider explaining results and f/u.  He is to return if worsening   Logan Bruce was evaluated in Emergency Department on 10/19/2020 for the symptoms described in the history of present illness. He was evaluated in the context of the global COVID-19 pandemic, which necessitated consideration that  the patient might be at risk for infection with the SARS-CoV-2 virus that causes COVID-19. Institutional protocols and algorithms that pertain to the evaluation of patients at risk for COVID-19 are in a state of rapid change based on information released by regulatory bodies including the CDC and federal and state organizations. These policies and algorithms were followed during the patient's care in the ED.    As part of my medical decision making, I reviewed the following data within the electronic MEDICAL RECORD NUMBER Nursing notes reviewed and incorporated, Labs reviewed , EKG interpreted NSR, Old chart reviewed, Radiograph reviewed , Notes from prior ED visits, and [place] Controlled Substance Database  ____________________________________________   FINAL CLINICAL IMPRESSION(S) / ED DIAGNOSES  Final diagnoses:  RUQ pain  Pain of upper abdomen  Viral illness      NEW MEDICATIONS STARTED DURING THIS VISIT:  Discharge Medication List as of 10/19/2020  4:21 PM       Note:  This document  was prepared using Dragon voice recognition software and may include unintentional dictation errors.    Faythe Ghee, PA-C 10/19/20 1630    Chesley Noon, MD 10/20/20 [id number]

## 2020-10-19 NOTE — Progress Notes (Signed)
   Subjective: Abdominal pain    Patient ID: Logan Bruce, male    DOB: Nov 08, 1965, 55 y.o.   MRN: [id number]  HPI Patient presents with 4 days of right upper quadrant abdominal pain, dry heaving with nausea, profuse sweating, and decreased urine voiding.  Patient unsure fever.  Patient denies recent travel or known contact with COVID-19.  Patient tested negative x2 of home test for COVID-19.  COVID PCR was taken today along with flu test.   Review of Systems GERD, hyperlipidemia, hypertension, and insomnia.    Objective:   Physical Exam Deferred since this is a virtual visit.       Assessment & Plan: Abdominal pain   Discussed with patient rationale for going to the ER for definitive evaluation and treatment.

## 2020-10-19 NOTE — ED Notes (Signed)
See triage note  Presents with "just not feeling well"  States he has had RUQ pain today  Hot and cold spells   Positive nausea  States he has taken 2 COVID tests which were negative

## 2020-10-21 LAB — SARS-COV-2, NAA 2 DAY TAT

## 2020-10-21 LAB — NOVEL CORONAVIRUS, NAA: SARS-CoV-2, NAA: NOT DETECTED

## 2020-10-26 ENCOUNTER — Encounter: Payer: Self-pay | Admitting: Physician Assistant

## 2020-10-26 ENCOUNTER — Ambulatory Visit: Payer: Self-pay | Admitting: Physician Assistant

## 2020-10-26 ENCOUNTER — Other Ambulatory Visit: Payer: Self-pay

## 2020-10-26 ENCOUNTER — Other Ambulatory Visit: Payer: Self-pay | Admitting: Physician Assistant

## 2020-10-26 VITALS — BP 134/96 | HR 90 | Temp 98.0°F | Resp 14 | Ht 67.0 in | Wt 165.0 lb

## 2020-10-26 DIAGNOSIS — G4709 Other insomnia: Secondary | ICD-10-CM

## 2020-10-26 DIAGNOSIS — Z8619 Personal history of other infectious and parasitic diseases: Secondary | ICD-10-CM

## 2020-10-26 MED ORDER — TRIAZOLAM 0.25 MG PO TABS: 0.5000 mg | ORAL_TABLET | Freq: Every evening | ORAL | 0 refills | Status: DC | PRN

## 2020-10-26 NOTE — Progress Notes (Signed)
Pt states he's doing a lot better just want to go over EKG and Labs from ER visit. CL,RMA

## 2020-10-26 NOTE — Progress Notes (Signed)
   Subjective:viral illness and insomnia    Patient ID: Logan Bruce, male    DOB: 07-Feb-1966, 55 y.o.   MRN: [id number]  HPI  Patient follow-up for emergency room visit where he was diagnosed with viral illness and dehydration.  Patient states feeling better status post IV hydration and nausea medicines.  Patient relates that he is having trouble staying asleep with his current doses of Halcion.  Patient currently takes 0.25 mg nightly.  Patient states he is also has decreased his alcohol intake which he believes might be adding to his insomnia.  Current dosage of 0.25 was prescribed in January 2022.  Review of Systems ADHD, GERD, hypertension, and insomnia.    Objective:   Physical Exam  No acute distress.  Temperature 98.7, pulse 80, BP 133/100, is 98% O2 sat on room air. HEENT is unremarkable.  Neck is supple without adenectomy or bruits.  Lungs are clear to auscultation.  Heart regular rate and rhythm. Abdomen with negative HSM, normoactive bowel sounds, soft and nontender to palpation.       Assessment & Plan: Viral illness and insomnia.   Patient is recovering well from viral illness.  Discussed increasing Halcion from 0.25 mg to 0.5 mg.  Patient also have a 3-day blood pressure check due to elevated diastolic readings in the clinic.  Follow-up status post 3-day blood pressure check.

## 2020-10-29 ENCOUNTER — Other Ambulatory Visit: Payer: Self-pay

## 2020-10-29 DIAGNOSIS — F901 Attention-deficit hyperactivity disorder, predominantly hyperactive type: Secondary | ICD-10-CM

## 2020-10-29 MED ORDER — AMPHETAMINE-DEXTROAMPHETAMINE 20 MG PO TABS: 20.0000 mg | ORAL_TABLET | Freq: Every morning | ORAL | 0 refills | Status: DC

## 2020-11-27 ENCOUNTER — Other Ambulatory Visit: Payer: Self-pay

## 2020-11-27 DIAGNOSIS — F901 Attention-deficit hyperactivity disorder, predominantly hyperactive type: Secondary | ICD-10-CM

## 2020-11-30 MED ORDER — AMPHETAMINE-DEXTROAMPHETAMINE 20 MG PO TABS: 20.0000 mg | ORAL_TABLET | Freq: Every morning | ORAL | 0 refills | Status: DC

## 2020-12-30 ENCOUNTER — Other Ambulatory Visit: Payer: Self-pay

## 2020-12-30 DIAGNOSIS — F901 Attention-deficit hyperactivity disorder, predominantly hyperactive type: Secondary | ICD-10-CM

## 2020-12-30 MED ORDER — AMPHETAMINE-DEXTROAMPHETAMINE 20 MG PO TABS: 20.0000 mg | ORAL_TABLET | Freq: Every morning | ORAL | 0 refills | Status: DC

## 2020-12-30 MED ORDER — TRIAZOLAM 0.25 MG PO TABS: 0.5000 mg | ORAL_TABLET | Freq: Every evening | ORAL | 0 refills | Status: DC | PRN

## 2021-01-04 ENCOUNTER — Other Ambulatory Visit: Payer: Self-pay

## 2021-01-04 ENCOUNTER — Ambulatory Visit: Payer: 59 | Admitting: Physician Assistant

## 2021-01-04 ENCOUNTER — Encounter: Payer: Self-pay | Admitting: Physician Assistant

## 2021-01-04 VITALS — BP 142/103 | HR 123 | Temp 98.4°F | Resp 14 | Ht 67.0 in | Wt 167.0 lb

## 2021-01-04 DIAGNOSIS — S29012A Strain of muscle and tendon of back wall of thorax, initial encounter: Secondary | ICD-10-CM

## 2021-01-04 NOTE — Progress Notes (Signed)
Pt possibly pulled a muscle in back about a week ago and missed work. Pt states wife massaged it out taking ibuprofen, heating pad etc. It seems to help but his boss needs a doctor note to come back to work./CL,RMA

## 2021-01-04 NOTE — Progress Notes (Signed)
   Subjective: Upper back pain    Patient ID: Logan Bruce, male    DOB: Feb 25, 1966, 55 y.o.   MRN: [id number]  HPI Patient presents for reevaluation of back pain secondary to repetitive lifting incident which occurred 4 days ago.  Patient states use conservative measures consist NSAIDs and heat massages.  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  Denies pain at this time.   Review of Systems ADHD, anxiety, and hypertension.    Objective:   Physical Exam No acute distress.  98.4, patient tachycardia 123.  Patient walked upstairs to the clinic.  We will retake prior to departure.  Respiration 14.  BP is 142/103 and patient 94% O2 sat on room air. Examination of the upper back reveals no deformity, edema, erythema, patient is nontender to palpation.  Patient has full and equal range of motion of the upper extremities.     Assessment & Plan: Resolved upper back pain.   Patient will return back to full duties.

## 2021-01-11 ENCOUNTER — Other Ambulatory Visit: Payer: Self-pay

## 2021-01-11 ENCOUNTER — Encounter: Payer: Self-pay | Admitting: Physician Assistant

## 2021-01-11 ENCOUNTER — Ambulatory Visit: Payer: Self-pay | Admitting: Physician Assistant

## 2021-01-11 VITALS — BP 142/101 | HR 110 | Temp 97.6°F | Resp 16 | Ht 67.0 in | Wt 165.0 lb

## 2021-01-11 DIAGNOSIS — F4323 Adjustment disorder with mixed anxiety and depressed mood: Secondary | ICD-10-CM
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
## 2021-01-11 NOTE — Progress Notes (Signed)
Pt has personal issues going at home that's affecting his work.

## 2021-01-29 ENCOUNTER — Other Ambulatory Visit: Payer: Self-pay

## 2021-01-29 DIAGNOSIS — F909 Attention-deficit hyperactivity disorder, unspecified type: Secondary | ICD-10-CM

## 2021-01-29 DIAGNOSIS — F901 Attention-deficit hyperactivity disorder, predominantly hyperactive type: Secondary | ICD-10-CM

## 2021-01-29 MED ORDER — TRIAZOLAM 0.25 MG PO TABS: 0.5000 mg | ORAL_TABLET | Freq: Every evening | ORAL | 0 refills | Status: DC | PRN

## 2021-01-29 MED ORDER — AMPHETAMINE-DEXTROAMPHETAMINE 20 MG PO TABS: 20.0000 mg | ORAL_TABLET | Freq: Every morning | ORAL | 0 refills | Status: DC

## 2021-02-08 ENCOUNTER — Other Ambulatory Visit: Payer: Self-pay

## 2021-02-08 DIAGNOSIS — Z1152 Encounter for screening for COVID-19: Secondary | ICD-10-CM

## 2021-02-08 LAB — POC COVID19 BINAXNOW: SARS Coronavirus 2 Ag: POSITIVE — AB

## 2021-02-08 LAB — POCT INFLUENZA A/B
Influenza A, POC: NEGATIVE
Influenza B, POC: NEGATIVE

## 2021-02-08 NOTE — Progress Notes (Signed)
First day of symptoms was Monday. Fever, cough and congestion./CL,RMA

## 2021-03-01 ENCOUNTER — Other Ambulatory Visit: Payer: Self-pay

## 2021-03-01 DIAGNOSIS — F909 Attention-deficit hyperactivity disorder, unspecified type: Secondary | ICD-10-CM

## 2021-03-01 MED ORDER — AMPHETAMINE-DEXTROAMPHETAMINE 20 MG PO TABS: 20.0000 mg | ORAL_TABLET | Freq: Every morning | ORAL | 0 refills | Status: DC

## 2021-03-22 ENCOUNTER — Other Ambulatory Visit: Payer: Self-pay

## 2021-03-22 DIAGNOSIS — G4709 Other insomnia: Secondary | ICD-10-CM

## 2021-03-22 DIAGNOSIS — I10 Essential (primary) hypertension: Secondary | ICD-10-CM

## 2021-03-22 DIAGNOSIS — F901 Attention-deficit hyperactivity disorder, predominantly hyperactive type: Secondary | ICD-10-CM

## 2021-03-22 MED ORDER — TRIAZOLAM 0.25 MG PO TABS: 0.5000 mg | ORAL_TABLET | Freq: Every evening | ORAL | 1 refills | Status: DC | PRN

## 2021-03-22 MED ORDER — IRBESARTAN 300 MG PO TABS: 300.0000 mg | ORAL_TABLET | Freq: Every day | ORAL | 3 refills | Status: DC

## 2021-03-30 ENCOUNTER — Other Ambulatory Visit: Payer: Self-pay

## 2021-03-30 ENCOUNTER — Ambulatory Visit: Payer: Self-pay

## 2021-03-30 DIAGNOSIS — Z0000 Encounter for general adult medical examination without abnormal findings: Secondary | ICD-10-CM

## 2021-03-30 LAB — POCT URINALYSIS DIPSTICK
Bilirubin, UA: NEGATIVE
Blood, UA: NEGATIVE
Glucose, UA: NEGATIVE
Ketones, UA: NEGATIVE
Leukocytes, UA: NEGATIVE
Nitrite, UA: NEGATIVE
Protein, UA: NEGATIVE
Spec Grav, UA: 1.02 (ref 1.010–1.025)
Urobilinogen, UA: 0.2 E.U./dL
pH, UA: 6 (ref 5.0–8.0)

## 2021-03-30 NOTE — Progress Notes (Signed)
04/06/20 annual physical scheduled.

## 2021-03-31 ENCOUNTER — Other Ambulatory Visit: Payer: Self-pay

## 2021-03-31 DIAGNOSIS — F909 Attention-deficit hyperactivity disorder, unspecified type: Secondary | ICD-10-CM

## 2021-03-31 LAB — CMP12+LP+TP+TSH+6AC+PSA+CBC…
ALT: 47 IU/L — ABNORMAL HIGH (ref 0–44)
AST: 41 IU/L — ABNORMAL HIGH (ref 0–40)
Albumin/Globulin Ratio: 1.8 (ref 1.2–2.2)
Albumin: 4.8 g/dL (ref 3.8–4.9)
Alkaline Phosphatase: 82 IU/L (ref 44–121)
BUN/Creatinine Ratio: 11 (ref 9–20)
BUN: 11 mg/dL (ref 6–24)
Basophils Absolute: 0.1 10*3/uL (ref 0.0–0.2)
Basos: 1 %
Bilirubin Total: 0.3 mg/dL (ref 0.0–1.2)
Calcium: 9.9 mg/dL (ref 8.7–10.2)
Chloride: 97 mmol/L (ref 96–106)
Chol/HDL Ratio: 2.6 ratio (ref 0.0–5.0)
Cholesterol, Total: 205 mg/dL — ABNORMAL HIGH (ref 100–199)
Creatinine, Ser: 1.04 mg/dL (ref 0.76–1.27)
EOS (ABSOLUTE): 0.1 10*3/uL (ref 0.0–0.4)
Eos: 2 %
Estimated CHD Risk: <0.5 times avg. (ref 0.0–1.0)
Free Thyroxine Index: 1.5 (ref 1.2–4.9)
GGT: 46 IU/L (ref 0–65)
Globulin, Total: 2.6 g/dL (ref 1.5–4.5)
Glucose: 90 mg/dL (ref 70–99)
HDL: 79 mg/dL (ref >39)
Hematocrit: 45.9 % (ref 37.5–51.0)
Hemoglobin: 16.1 g/dL (ref 13.0–17.7)
Immature Grans (Abs): 0 10*3/uL (ref 0.0–0.1)
Immature Granulocytes: 0 %
Iron: 93 ug/dL (ref 38–169)
LDH: 236 IU/L — ABNORMAL HIGH (ref 121–224)
LDL Chol Calc (NIH): 111 mg/dL — ABNORMAL HIGH (ref 0–99)
Lymphocytes Absolute: 1.2 10*3/uL (ref 0.7–3.1)
Lymphs: 26 %
MCH: 33.3 pg — ABNORMAL HIGH (ref 26.6–33.0)
MCHC: 35.1 g/dL (ref 31.5–35.7)
MCV: 95 fL (ref 79–97)
Monocytes Absolute: 0.4 10*3/uL (ref 0.1–0.9)
Monocytes: 9 %
Neutrophils Absolute: 2.8 10*3/uL (ref 1.4–7.0)
Neutrophils: 62 %
Phosphorus: 2.3 mg/dL — ABNORMAL LOW (ref 2.8–4.1)
Platelets: 332 10*3/uL (ref 150–450)
Potassium: 4.6 mmol/L (ref 3.5–5.2)
Prostate Specific Ag, Serum: 1.4 ng/mL (ref 0.0–4.0)
RBC: 4.84 x10E6/uL (ref 4.14–5.80)
RDW: 11.8 % (ref 11.6–15.4)
Sodium: 134 mmol/L (ref 134–144)
T3 Uptake Ratio: 27 % (ref 24–39)
T4, Total: 5.6 ug/dL (ref 4.5–12.0)
TSH: 2.48 u[IU]/mL (ref 0.450–4.500)
Total Protein: 7.4 g/dL (ref 6.0–8.5)
Triglycerides: 82 mg/dL (ref 0–149)
Uric Acid: 7.4 mg/dL (ref 3.8–8.4)
VLDL Cholesterol Cal: 15 mg/dL (ref 5–40)
WBC: 4.6 10*3/uL (ref 3.4–10.8)
eGFR: 86 mL/min/{1.73_m2} (ref >59)

## 2021-04-01 MED ORDER — AMPHETAMINE-DEXTROAMPHETAMINE 20 MG PO TABS: 20.0000 mg | ORAL_TABLET | Freq: Every morning | ORAL | 0 refills | Status: DC

## 2021-04-06 ENCOUNTER — Other Ambulatory Visit: Payer: Self-pay

## 2021-04-06 ENCOUNTER — Ambulatory Visit: Payer: Self-pay | Admitting: Physician Assistant

## 2021-04-06 ENCOUNTER — Encounter: Payer: Self-pay | Admitting: Physician Assistant

## 2021-04-06 VITALS — BP 135/97 | HR 104 | Temp 98.2°F | Resp 14 | Ht 67.0 in | Wt 160.0 lb

## 2021-04-06 DIAGNOSIS — Z0000 Encounter for general adult medical examination without abnormal findings: Secondary | ICD-10-CM

## 2021-04-06 NOTE — Progress Notes (Signed)
Pt denies any issues or concerns at this time. °

## 2021-04-06 NOTE — Progress Notes (Signed)
[name]  ____________________________________________   None    (approximate)  I have reviewed the triage vital signs and the nursing notes.   HISTORY  Chief Complaint Annual Exam    HPI Logan Bruce is a 56 y.o. male patient presents for annual physical exam.  Patient voices no concerns or complaints.  Patient past medical history remarkable for 3 surgeries to the left knee.  Patient states started developing right knee pain secondary to compensating for her left knee.      Past Medical History:  Diagnosis Date   Acid reflux    Diverticulitis    Hypertension     Patient Active Problem List   Diagnosis Date Noted   Essential hypertension 06/10/2019   Attention deficit hyperactivity disorder (ADHD) 06/10/2019   Other insomnia 06/10/2019   Gastroesophageal reflux disease without esophagitis 06/10/2019   Tendinitis of thumb 06/10/2019   Mixed hyperlipidemia 06/10/2019   History of prediabetes 06/10/2019    Past Surgical History:  Procedure Laterality Date   KNEE SURGERY Left    x3 reconstruction-[facility]    Prior to Admission medications   Medication Sig Start Date End Date Taking? Authorizing Provider  ALPRAZolam (XANAX) 0.25 MG tablet Take 1 tablet (0.25 mg total) by mouth at bedtime. 03/20/20  Yes Sable Feil, PA-C  amLODipine (NORVASC) 10 MG tablet Take 1 tablet (10 mg total) by mouth daily. 09/15/20  Yes Sable Feil, PA-C  amphetamine-dextroamphetamine (ADDERALL) 20 MG tablet Take 1 tablet (20 mg total) by mouth in the morning. 04/01/21  Yes Sable Feil, PA-C  irbesartan (AVAPRO) 300 MG tablet Take 1 tablet (300 mg total) by mouth daily. Have patient monitor bp, bring all reading to next visit 03/22/21  Yes Sable Feil, PA-C  omeprazole (PRILOSEC) 20 MG capsule Take 1 capsule (20 mg total) by mouth daily. 09/15/20  Yes Sable Feil, PA-C  potassium chloride SA (KLOR-CON) 20 MEQ tablet Take 2 tablets (40 mEq  total) by mouth daily. Take with apple juice 03/19/20  Yes Sable Feil, PA-C  triazolam (HALCION) 0.25 MG tablet Take 2 tablets (0.5 mg total) by mouth at bedtime as needed for sleep. 03/22/21  Yes Sable Feil, PA-C    Allergies Patient has no known allergies.  Family History  Problem Relation Age of Onset   Cancer Father     Social History Social History   Tobacco Use   Smoking status: Never   Smokeless tobacco: Current    Types: Snuff  Vaping Use   Vaping Use: Never used  Substance Use Topics   Alcohol use: Yes    Alcohol/week: 42.0 standard drinks    Types: 42 Cans of beer per week   Drug use: No    Review of Systems Constitutional: No fever/chills Eyes: No visual changes. ENT: No sore throat. Cardiovascular: Denies chest pain. Respiratory: Denies shortness of breath. Gastrointestinal: No abdominal pain.  No nausea, no vomiting.  No diarrhea.  No constipation. Genitourinary: Negative for dysuria. Musculoskeletal: Negative for back pain.  Bilateral knee pain. Skin: Negative for rash. Neurological: Negative for headaches, focal weakness or numbness. Psychiatric: Anxiety ADD Endocrine: Hypertension prediabetes ____________________________________________   PHYSICAL EXAM:  VITAL SIGNS: Temperature 98.2, pulse 104, BP 135/97, respiration 14.  Patient weighs 160 pounds and BMI is 25.06. Eyes: Conjunctivae are normal. PERRL. EOMI. Head: Atraumatic. Nose: No congestion/rhinnorhea. Mouth/Throat: Mucous membranes are moist.  Oropharynx non-erythematous. Neck: No stridor.  No cervical spine tenderness to palpation.  Hematological/Lymphatic/Immunilogical: No cervical lymphadenopathy. Cardiovascular: Normal rate, regular rhythm. Grossly normal heart sounds.  Good peripheral circulation. Respiratory: Normal respiratory effort.  No retractions. Lungs CTAB. Gastrointestinal: Soft and nontender. No distention. No abdominal bruits. No CVA tenderness. Genitourinary:  Deferred Musculoskeletal: Edema to the suprapatellar area of the right knee.  No joint effusions. Neurologic:  Normal speech and language. No gross focal neurologic deficits are appreciated. No gait instability. Skin:  Skin is warm, dry and intact. No rash noted.  Surgical scars to left knee secondary to history. Psychiatric: Mood and affect are normal. Speech and behavior are normal.  ____________________________________________   LABS  ______           Component Ref Range & Units 7 d ago (03/30/21) 5 mo ago (10/19/20) 5 mo ago (10/19/20) 1 yr ago (03/20/20) 1 yr ago (02/25/20) 1 yr ago (07/03/19) 1 yr ago (07/03/19)  Glucose 70 - 99 mg/dL 90  114 High  CM   103 High  R  101 High  R   98 R, CM   Uric Acid 3.8 - 8.4 mg/dL 7.4     6.3 CM     Comment:            Therapeutic target for gout patients: <6.0  BUN 6 - 24 mg/dL 11  12 R   '13  9   8 '$ R   Creatinine, Ser 0.76 - 1.27 mg/dL 1.04  1.09 R   1.13  1.03   0.97 R, CM   eGFR >59 mL/min/1.73 86         BUN/Creatinine Ratio 9 - $R'20 11    12  9   'ZZ$ NOT APPLICABLE R   Sodium 709 - 144 mmol/L 134  135 R   133 Low   140   138 R   Potassium 3.5 - 5.2 mmol/L 4.6  4.6 R   4.5  5.0   4.9 R   Chloride 96 - 106 mmol/L 97  100 R   94 Low   100   101 R   Calcium 8.7 - 10.2 mg/dL 9.9  9.7 R   9.8  9.1   9.9 R   Phosphorus 2.8 - 4.1 mg/dL 2.3 Low      3.6     Total Protein 6.0 - 8.5 g/dL 7.4  8.2 High  R    6.5     Albumin 3.8 - 4.9 g/dL 4.8  5.0 R    4.4     Globulin, Total 1.5 - 4.5 g/dL 2.6     2.1     Albumin/Globulin Ratio 1.2 - 2.2 1.8     2.1     Bilirubin Total 0.0 - 1.2 mg/dL 0.3  0.9 R    0.3     Alkaline Phosphatase 44 - 121 IU/L 82  72 R    68 CM     LDH 121 - 224 IU/L 236 High      201     AST 0 - 40 IU/L 41 High   48 High  R    28     ALT 0 - 44 IU/L 47 High   47 High  R    27     GGT 0 - 65 IU/L 46     20     Iron 38 - 169 ug/dL 93     83     Cholesterol, Total 100 - 199 mg/dL 205 High      186  Triglycerides 0 - 149 mg/dL 82     86   46 R    HDL >39 mg/dL 79     65  68 R    VLDL Cholesterol Cal 5 - 40 mg/dL 15     16     LDL Chol Calc (NIH) 0 - 99 mg/dL 111 High      105 High      Chol/HDL Ratio 0.0 - 5.0 ratio 2.6     2.9 CM  2.7 R    Comment:                                   T. Chol/HDL Ratio                                              Men  Women                                1/2 Avg.Risk  3.4    3.3                                    Avg.Risk  5.0    4.4                                 2X Avg.Risk  9.6    7.1                                 3X Avg.Risk 23.4   11.0   Estimated CHD Risk 0.0 - 1.0 times avg.  < 0.5      < 0.5 CM     Comment: The CHD Risk is based on the T. Chol/HDL ratio. Other  factors affect CHD Risk such as hypertension, smoking,  diabetes, severe obesity, and family history of  premature CHD.   TSH 0.450 - 4.500 uIU/mL 2.480     1.790     T4, Total 4.5 - 12.0 ug/dL 5.6     5.5     T3 Uptake Ratio 24 - 39 % 27     30     Free Thyroxine Index 1.2 - 4.9 1.5     1.7     Prostate Specific Ag, Serum 0.0 - 4.0 ng/mL 1.4     1.3 CM     Comment: Roche ECLIA methodology.  According to the American Urological Association, Serum PSA should  decrease and remain at undetectable levels after radical  prostatectomy. The AUA defines biochemical recurrence as an initial  PSA value 0.2 ng/mL or greater followed by a subsequent confirmatory  PSA value 0.2 ng/mL or greater.  Values obtained with different assay methods or kits cannot be used  interchangeably. Results cannot be interpreted as absolute evidence  of the presence or absence of malignant disease.   WBC 3.4 - 10.8 x10E3/uL 4.6   6.5 R   3.6     RBC 4.14 - 5.80 x10E6/uL 4.84   5.28 R   4.64     Hemoglobin 13.0 - 17.7  g/dL 16.1   17.3 High  R   15.0     Hematocrit 37.5 - 51.0 % 45.9   50.4 R   43.8     MCV 79 - 97 fL 95   95.5 R   94     MCH 26.6 - 33.0 pg 33.3 High    32.8 R   32.3     MCHC 31.5 - 35.7 g/dL 35.1   34.3 R   34.2     RDW 11.6 -  15.4 % 11.8   11.8 R   11.7     Platelets 150 - 450 x10E3/uL 332   284 R   268     Neutrophils Not Estab. % 62     58     Lymphs Not Estab. % 26     27     Monocytes Not Estab. % 9     11     Eos Not Estab. % 2     3     Basos Not Estab. % 1     1     Neutrophils Absolute 1.4 - 7.0 x10E3/uL 2.8     2.1     Lymphocytes Absolute 0.7 - 3.1 x10E3/uL 1.2     1.0     Monocytes Absolute 0.1 - 0.9 x10E3/uL 0.4     0.4     EOS (ABSOLUTE) 0.0 - 0.4 x10E3/uL 0.1     0.1     Basophils Absolute 0.0 - 0.2 x10E3/uL 0.1     0.0     Immature Granulocytes Not Estab. % 0     0     Immature Grans (Abs) 0.0 - 0.1 x10E3/uL 0.0     0.0     Resulting Agency  [facility]       Narrative Performed by: Maryan Puls Performed at:  [facility]  [address] [place], [place]  [id number]  Lab Director: Rush Farmer MD, Phone:  [phone]    Specimen Collected: 03/30/21 08:41 Last Resulted: 03/31/21 08:14      Lab Flowsheet    Order Details    View Encounter    Lab and Collection Details    Routing    Result History    View Encounter Conversation      CM=Additional comments  R=Reference range differs from displayed range      Result Care Coordination   Patient Communication   Add Comments   Add Notifications  Back to Top       Other Results from 03/30/2021   POCT urinalysis dipstick Order: [id number] Status: Final result    Visible to patient: Yes (not seen)    Next appt: None    Dx: Routine adult health maintenance    0 Result Notes        Component Ref Range & Units 7 d ago 5 mo ago 1 yr ago 8 yr ago  Color, UA  yellow   yellow    Clarity, UA  clear   clear    Glucose, UA Negative Negative   Negative    Bilirubin, UA  negative   negative    Ketones, UA  negative   negative    Spec Grav, UA 1.010 - 1.025 1.020   1.025    Blood, UA  negative   negative    pH, UA 5.0 - 8.0 6.0   6.0    Protein, UA Negative  Negative   Negative     Urobilinogen, UA 0.2 or 1.0 E.U./dL 0.2   0.2  0.2 R   Nitrite, UA  negative   negative    Leukocytes, UA Negative Negative   Negative  NEGATIVE R, CM   Appearance  dark  HAZY Abnormal  R  medium  CLEAR R            ______________________________________  EKGSinus  Tachycardia  -RSR(V1) -nondiagnostic.   -Left atrial enlargement.   BORDERLINE   ____________________________________________  ____________________________________________   INITIAL IMPRESSION / ASSESSMENT AND PLAN / ED COURSE  As part of my medical decision making, I reviewed the following data within the [place]  Well exam.  Discussed referral to orthopedics for increasing knee pain.  Discussed national shortage of t Adderall and we will probably need prior authorization to refill this medication             ____________________________________________    ED Discharge Orders     None        Note:  This document was prepared using Dragon voice recognition software and may include unintentional dictation errors.

## 2021-04-23 ENCOUNTER — Other Ambulatory Visit: Payer: Self-pay | Admitting: Physician Assistant

## 2021-04-23 DIAGNOSIS — K219 Gastro-esophageal reflux disease without esophagitis: Secondary | ICD-10-CM

## 2021-04-28 ENCOUNTER — Other Ambulatory Visit: Payer: Self-pay

## 2021-04-28 DIAGNOSIS — K219 Gastro-esophageal reflux disease without esophagitis: Secondary | ICD-10-CM

## 2021-04-28 DIAGNOSIS — F909 Attention-deficit hyperactivity disorder, unspecified type: Secondary | ICD-10-CM

## 2021-04-28 MED ORDER — AMPHETAMINE-DEXTROAMPHETAMINE 20 MG PO TABS: 20.0000 mg | ORAL_TABLET | Freq: Every morning | ORAL | 0 refills | Status: DC

## 2021-04-28 MED ORDER — OMEPRAZOLE 20 MG PO CPDR: 20.0000 mg | DELAYED_RELEASE_CAPSULE | Freq: Every day | ORAL | 1 refills | Status: DC

## 2021-05-07 ENCOUNTER — Other Ambulatory Visit: Payer: Self-pay

## 2021-05-13 ENCOUNTER — Other Ambulatory Visit: Payer: Self-pay

## 2021-05-13 DIAGNOSIS — G4709 Other insomnia: Secondary | ICD-10-CM

## 2021-05-13 MED ORDER — TRIAZOLAM 0.25 MG PO TABS: 0.5000 mg | ORAL_TABLET | Freq: Every evening | ORAL | 1 refills | Status: DC | PRN

## 2021-06-02 ENCOUNTER — Other Ambulatory Visit: Payer: Self-pay

## 2021-06-02 DIAGNOSIS — F909 Attention-deficit hyperactivity disorder, unspecified type: Secondary | ICD-10-CM

## 2021-06-02 MED ORDER — AMPHETAMINE-DEXTROAMPHETAMINE 20 MG PO TABS: 20.0000 mg | ORAL_TABLET | Freq: Every morning | ORAL | 0 refills | Status: DC

## 2021-06-04 ENCOUNTER — Ambulatory Visit
Admission: EM | Admit: 2021-06-04 | Discharge: 2021-06-04 | Disposition: A | Discharge status: Home / Self Care | Payer: 59 | Attending: Emergency Medicine | Admitting: Emergency Medicine

## 2021-06-04 ENCOUNTER — Other Ambulatory Visit: Payer: Self-pay

## 2021-06-04 ENCOUNTER — Encounter: Payer: Self-pay | Admitting: Emergency Medicine

## 2021-06-04 DIAGNOSIS — J302 Other seasonal allergic rhinitis: Secondary | ICD-10-CM

## 2021-06-04 MED ORDER — DEXAMETHASONE SODIUM PHOSPHATE 10 MG/ML IJ SOLN
10.0000 mg | Freq: Once | INTRAMUSCULAR | Status: AC
  Administered 2021-06-04: 10 mg via INTRAMUSCULAR

## 2021-06-04 NOTE — Discharge Instructions (Addendum)
You were given an injection of dexamethasone today.  ? ?Use Flonase nasal spray and take Zyrtec as discussed.  Follow up with your primary care provider if your symptoms are not improving.   ? ?

## 2021-06-04 NOTE — ED Triage Notes (Signed)
Pt c/o cough, runny nose and sinus pressure x 2 days.  ?

## 2021-06-04 NOTE — ED Provider Notes (Signed)
?[facility] ? ? ? ?CSN: [id number] ?Arrival date & time: 06/04/21  [id number] ? ? ?  ? ?History   ?Chief Complaint ?Chief Complaint  ?Patient presents with  ? Nasal Congestion  ? Sinus Pressure  ? Cough  ? ? ?HPI ?Logan Bruce is a 56 y.o. male.  Patient presents with 2-3 day history of sinus pressure, runny nose, postnasal drip, cough.  Treatment at home with Benadryl.  He denies fever, chills, shortness of breath, or other symptoms.  Patient requests a shot for his allergy symptoms.  His medical history includes hypertension, prediabetes, GERD. ? ?The history is provided by the patient and medical records.  ? ?Past Medical History:  ?Diagnosis Date  ? Acid reflux   ? Diverticulitis   ? Hypertension   ? ? ?Patient Active Problem List  ? Diagnosis Date Noted  ? Essential hypertension 06/10/2019  ? Attention deficit hyperactivity disorder (ADHD) 06/10/2019  ? Other insomnia 06/10/2019  ? Gastroesophageal reflux disease without esophagitis 06/10/2019  ? Tendinitis of thumb 06/10/2019  ? Mixed hyperlipidemia 06/10/2019  ? History of prediabetes 06/10/2019  ? ? ?Past Surgical History:  ?Procedure Laterality Date  ? KNEE SURGERY Left   ? x3 [facility]  ? ? ? ? ? ?Home Medications   ? ?Prior to Admission medications   ?Medication Sig Start Date End Date Taking? Authorizing Provider  ?amLODipine (NORVASC) 10 MG tablet Take 1 tablet (10 mg total) by mouth daily. 09/15/20  Yes Sable Feil, PA-C  ?amphetamine-dextroamphetamine (ADDERALL) 20 MG tablet Take 1 tablet (20 mg total) by mouth in the morning. 06/02/21  Yes Sable Feil, PA-C  ?irbesartan (AVAPRO) 300 MG tablet Take 1 tablet (300 mg total) by mouth daily. Have patient monitor bp, bring all reading to next visit 03/22/21  Yes Sable Feil, PA-C  ?omeprazole (PRILOSEC) 20 MG capsule Take 1 capsule (20 mg total) by mouth daily. 04/28/21  Yes Sable Feil, PA-C  ?triazolam (HALCION) 0.25 MG tablet Take 2 tablets (0.5 mg  total) by mouth at bedtime as needed for sleep. 05/13/21  Yes Sable Feil, PA-C  ?ALPRAZolam (XANAX) 0.25 MG tablet Take 1 tablet (0.25 mg total) by mouth at bedtime. 03/20/20   Sable Feil, PA-C  ?potassium chloride SA (KLOR-CON) 20 MEQ tablet Take 2 tablets (40 mEq total) by mouth daily. Take with apple juice 03/19/20   Sable Feil, PA-C  ? ? ?Family History ?Family History  ?Problem Relation Age of Onset  ? Cancer Father   ? ? ?Social History ?Social History  ? ?Tobacco Use  ? Smoking status: Never  ? Smokeless tobacco: Current  ?  Types: Snuff  ?Vaping Use  ? Vaping Use: Never used  ?Substance Use Topics  ? Alcohol use: Yes  ?  Alcohol/week: 42.0 standard drinks  ?  Types: 42 Cans of beer per week  ? Drug use: No  ? ? ? ?Allergies   ?Patient has no known allergies. ? ? ?Review of Systems ?Review of Systems  ?Constitutional:  Negative for chills and fever.  ?HENT:  Positive for congestion, postnasal drip, rhinorrhea and sinus pressure. Negative for ear pain and sore throat.   ?Respiratory:  Positive for cough. Negative for shortness of breath.   ?Cardiovascular:  Negative for chest pain and palpitations.  ?Gastrointestinal:  Negative for diarrhea and vomiting.  ?Skin:  Negative for color change and rash.  ?All other systems reviewed and are negative. ? ? ?Physical Exam ?  Triage Vital Signs ?ED Triage Vitals  ?Enc Vitals Group  ?   BP   ?   Pulse   ?   Resp   ?   Temp   ?   Temp src   ?   SpO2   ?   Weight   ?   Height   ?   Head Circumference   ?   Peak Flow   ?   Pain Score   ?   Pain Loc   ?   Pain Edu?   ?   Excl. in [place]?   ? ?No data found. ? ?Updated Vital Signs ?BP (!) 141/93   Pulse 95   Temp 97.9 ?F (36.6 ?C)   Resp 18   SpO2 98%  ? ?Visual Acuity ?Right Eye Distance:   ?Left Eye Distance:   ?Bilateral Distance:   ? ?Right Eye Near:   ?Left Eye Near:    ?Bilateral Near:    ? ?Physical Exam ?Vitals and nursing note reviewed.  ?Constitutional:   ?   General: He is not in acute distress. ?    Appearance: Normal appearance. He is well-developed. He is not ill-appearing.  ?HENT:  ?   Head: Normocephalic and atraumatic.  ?   Right Ear: Tympanic membrane normal.  ?   Left Ear: Tympanic membrane normal.  ?   Nose: Rhinorrhea present.  ?   Mouth/Throat:  ?   Mouth: Mucous membranes are moist.  ?   Pharynx: Oropharynx is clear.  ?Cardiovascular:  ?   Rate and Rhythm: Normal rate and regular rhythm.  ?   Heart sounds: Normal heart sounds.  ?Pulmonary:  ?   Effort: Pulmonary effort is normal. No respiratory distress.  ?   Breath sounds: Normal breath sounds.  ?Musculoskeletal:  ?   Cervical back: Neck supple.  ?Skin: ?   General: Skin is warm and dry.  ?Neurological:  ?   Mental Status: He is alert.  ?Psychiatric:     ?   Mood and Affect: Mood normal.     ?   Behavior: Behavior normal.  ? ? ? ?UC Treatments / Results  ?Labs ?(all labs ordered are listed, but only abnormal results are displayed) ?Labs Reviewed - No data to display ? ?EKG ? ? ?Radiology ?No results found. ? ?Procedures ?Procedures (including critical care time) ? ?Medications Ordered in UC ?Medications  ?dexamethasone (DECADRON) injection 10 mg (10 mg Intramuscular Given 06/04/21 1108)  ? ? ?Initial Impression / Assessment and Plan / UC Course  ?I have reviewed the triage vital signs and the nursing notes. ? ?Pertinent labs & imaging results that were available during my care of the patient were reviewed by me and considered in my medical decision making (see chart for details). ? ?  ?Seasonal allergies.  Treating with dexamethasone injection today.  Discussed Flonase nasal spray and Zyrtec for allergy symptoms.  Instructed him to follow-up with his PCP if his symptoms are not improving.  He agrees to plan of care. ? ?Final Clinical Impressions(s) / UC Diagnoses  ? ?Final diagnoses:  ?Seasonal allergies  ? ? ? ?Discharge Instructions   ? ?  ?You were given an injection of dexamethasone today.  ? ?Use Flonase nasal spray and take Zyrtec as discussed.   Follow up with your primary care provider if your symptoms are not improving.   ? ? ? ? ? ?ED Prescriptions   ?None ?  ? ?PDMP not reviewed this encounter. ?  ?  Sharion Balloon, NP ?06/04/21 1108 ? ?

## 2021-06-07 ENCOUNTER — Ambulatory Visit
Admission: EM | Admit: 2021-06-07 | Discharge: 2021-06-07 | Disposition: A | Discharge status: Home / Self Care | Payer: 59 | Attending: Family Medicine | Admitting: Family Medicine

## 2021-06-07 ENCOUNTER — Encounter: Payer: Self-pay | Admitting: Emergency Medicine

## 2021-06-07 ENCOUNTER — Other Ambulatory Visit: Payer: Self-pay

## 2021-06-07 DIAGNOSIS — J0140 Acute pansinusitis, unspecified: Secondary | ICD-10-CM | POA: Diagnosis not present

## 2021-06-07 MED ORDER — PROMETHAZINE-DM 6.25-15 MG/5ML PO SYRP: 5.0000 mL | ORAL_SOLUTION | Freq: Four times a day (QID) | ORAL | 0 refills | Status: DC | PRN

## 2021-06-07 MED ORDER — AZITHROMYCIN 250 MG PO TABS: ORAL_TABLET | ORAL | 0 refills | Status: DC

## 2021-06-07 NOTE — ED Provider Notes (Signed)
?[facility] ? ? ? ?CSN: [id number] ?Arrival date & time: 06/07/21  1411 ? ? ?  ? ?History   ?Chief Complaint ?Chief Complaint  ?Patient presents with  ? Cough  ? Nasal Congestion  ? Headache  ? ? ?HPI ?Logan Bruce is a 56 y.o. male.  ? ?HPI ?Patient returns urgent care for evaluation of ongoing cough, nasal congestion and headache.  Patient seen 3 days ago and evaluated by provider.  He received a Decadron injection here in clinic and Rx for Flonase.  He reports symptoms have not improved however they have worsened.  He complains of persistent cough, nasal congestion and headache. Denies shortness of breath, weakness, or chest pain. ?Past Medical History:  ?Diagnosis Date  ? Acid reflux   ? Diverticulitis   ? Hypertension   ? ? ?Patient Active Problem List  ? Diagnosis Date Noted  ? Essential hypertension 06/10/2019  ? Attention deficit hyperactivity disorder (ADHD) 06/10/2019  ? Other insomnia 06/10/2019  ? Gastroesophageal reflux disease without esophagitis 06/10/2019  ? Tendinitis of thumb 06/10/2019  ? Mixed hyperlipidemia 06/10/2019  ? History of prediabetes 06/10/2019  ? ? ?Past Surgical History:  ?Procedure Laterality Date  ? KNEE SURGERY Left   ? x3 [facility]  ? ? ? ? ? ?Home Medications   ? ?Prior to Admission medications   ?Medication Sig Start Date End Date Taking? Authorizing Provider  ?ALPRAZolam (XANAX) 0.25 MG tablet Take 1 tablet (0.25 mg total) by mouth at bedtime. 03/20/20   Joni Reining, PA-C  ?amLODipine (NORVASC) 10 MG tablet Take 1 tablet (10 mg total) by mouth daily. 09/15/20   Joni Reining, PA-C  ?amphetamine-dextroamphetamine (ADDERALL) 20 MG tablet Take 1 tablet (20 mg total) by mouth in the morning. 06/02/21   Joni Reining, PA-C  ?irbesartan (AVAPRO) 300 MG tablet Take 1 tablet (300 mg total) by mouth daily. Have patient monitor bp, bring all reading to next visit 03/22/21   Joni Reining, PA-C  ?omeprazole (PRILOSEC) 20 MG capsule Take  1 capsule (20 mg total) by mouth daily. 04/28/21   Joni Reining, PA-C  ?potassium chloride SA (KLOR-CON) 20 MEQ tablet Take 2 tablets (40 mEq total) by mouth daily. Take with apple juice 03/19/20   Joni Reining, PA-C  ?triazolam (HALCION) 0.25 MG tablet Take 2 tablets (0.5 mg total) by mouth at bedtime as needed for sleep. 05/13/21   Joni Reining, PA-C  ? ? ?Family History ?Family History  ?Problem Relation Age of Onset  ? Cancer Father   ? ? ?Social History ?Social History  ? ?Tobacco Use  ? Smoking status: Never  ? Smokeless tobacco: Current  ?  Types: Snuff  ?Vaping Use  ? Vaping Use: Never used  ?Substance Use Topics  ? Alcohol use: Yes  ?  Alcohol/week: 42.0 standard drinks  ?  Types: 42 Cans of beer per week  ? Drug use: No  ? ? ? ?Allergies   ?Patient has no known allergies. ? ? ?Review of Systems ?Review of Systems ?Pertinent negatives listed in HPI  ? ?Physical Exam ?Triage Vital Signs ?ED Triage Vitals [06/07/21 1413]  ?Enc Vitals Group  ?   BP   ?   Pulse   ?   Resp   ?   Temp   ?   Temp src   ?   SpO2   ?   Weight   ?   Height   ?  Head Circumference   ?   Peak Flow   ?   Pain Score 4  ?   Pain Loc   ?   Pain Edu?   ?   Excl. in [place]?   ? ?No data found. ? ?Updated Vital Signs ?There were no vitals taken for this visit. ? ?Visual Acuity ?Right Eye Distance:   ?Left Eye Distance:   ?Bilateral Distance:   ? ?Right Eye Near:   ?Left Eye Near:    ?Bilateral Near:    ? ?Physical Exam ? ?General Appearance:    Alert, cooperative, acutely ill appearing, no distress  ?HENT:   Normocephalic, ears normal, nares mucosal edema with congestion, rhinorrhea, oropharynx without erythema or exudate  ?Eyes:    PERRL, conjunctiva/corneas clear, EOM's intact       ?Lungs:     Clear to auscultation bilaterally, respirations unlabored  ?Heart:    Regular rate and rhythm  ?Neurologic:   Awake, alert, oriented x 3. No apparent focal neurological           defect.   ?  ? ?UC Treatments / Results  ?Labs ?(all labs ordered are  listed, but only abnormal results are displayed) ?Labs Reviewed - No data to display ? ?EKG ? ? ?Radiology ?No results found. ? ?Procedures ?Procedures (including critical care time) ? ?Medications Ordered in UC ?Medications - No data to display ? ?Initial Impression / Assessment and Plan / UC Course  ?I have reviewed the triage vital signs and the nursing notes. ? ?Pertinent labs & imaging results that were available during my care of the patient were reviewed by me and considered in my medical decision making (see chart for details). ? ? Acute non-recurrent pansinusitis  ?Azithromycin and Promethazine DM ?Strict return precautions given. ?Final Clinical Impressions(s) / UC Diagnoses  ? ?Final diagnoses:  ?Acute non-recurrent pansinusitis  ? ?Discharge Instructions   ?None ?  ? ?ED Prescriptions   ? ? Medication Sig Dispense Auth. Provider  ? azithromycin (ZITHROMAX) 250 MG tablet Take 2 tabs PO x 1 dose, then 1 tab PO QD x 4 days 6 tablet Bing Neighbors, FNP  ? promethazine-dextromethorphan (PROMETHAZINE-DM) 6.25-15 MG/5ML syrup Take 5 mLs by mouth 4 (four) times daily as needed for cough. 180 mL Bing Neighbors, FNP  ? ?  ? ?PDMP not reviewed this encounter. ?  ?Bing Neighbors, FNP ?06/13/21 [phone] ? ?

## 2021-06-07 NOTE — ED Triage Notes (Signed)
Pt here with cough, congestion and headache x 5 days. Pt was seen on Friday and given a shot of decadron and rx for flonase.  ?

## 2021-06-30 ENCOUNTER — Other Ambulatory Visit: Payer: Self-pay

## 2021-06-30 DIAGNOSIS — G4709 Other insomnia: Secondary | ICD-10-CM

## 2021-06-30 DIAGNOSIS — F909 Attention-deficit hyperactivity disorder, unspecified type: Secondary | ICD-10-CM

## 2021-06-30 MED ORDER — AMPHETAMINE-DEXTROAMPHETAMINE 20 MG PO TABS: 20.0000 mg | ORAL_TABLET | Freq: Every morning | ORAL | 0 refills | Status: DC

## 2021-06-30 MED ORDER — TRIAZOLAM 0.25 MG PO TABS: 0.5000 mg | ORAL_TABLET | Freq: Every evening | ORAL | 1 refills | Status: DC | PRN

## 2021-08-02 ENCOUNTER — Other Ambulatory Visit: Payer: Self-pay

## 2021-08-02 DIAGNOSIS — F909 Attention-deficit hyperactivity disorder, unspecified type: Secondary | ICD-10-CM

## 2021-08-02 MED ORDER — AMPHETAMINE-DEXTROAMPHETAMINE 20 MG PO TABS: 20.0000 mg | ORAL_TABLET | Freq: Every morning | ORAL | 0 refills | Status: DC

## 2021-08-18 ENCOUNTER — Encounter (HOSPITAL_COMMUNITY): Payer: Self-pay | Admitting: Registered Nurse

## 2021-08-18 ENCOUNTER — Ambulatory Visit (HOSPITAL_COMMUNITY)
Admission: EM | Admit: 2021-08-18 | Discharge: 2021-08-18 | Disposition: A | Discharge status: Home / Self Care | Payer: 59 | Attending: Registered Nurse | Admitting: Registered Nurse

## 2021-08-18 ENCOUNTER — Other Ambulatory Visit: Payer: Self-pay

## 2021-08-18 DIAGNOSIS — Z638 Other specified problems related to primary support group: Secondary | ICD-10-CM | POA: Diagnosis not present

## 2021-08-18 DIAGNOSIS — R69 Illness, unspecified: Secondary | ICD-10-CM | POA: Diagnosis not present

## 2021-08-18 DIAGNOSIS — G4709 Other insomnia: Secondary | ICD-10-CM

## 2021-08-18 MED ORDER — TRIAZOLAM 0.25 MG PO TABS: 0.5000 mg | ORAL_TABLET | Freq: Every evening | ORAL | 1 refills | Status: DC | PRN
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
## 2021-08-18 NOTE — Discharge Instructions (Addendum)
Substance Abuse Treatment Programs ° °Intensive Outpatient Programs °[facility]     °[address]      °[place], [place]                   °[phone]      ° °[facility] °[address] °[place], [place] °[phone] ° °[facility]     °(Inpatient and outpatient)     °[address]           °[phone]   ° °[facility] °[phone] (Suboxone and Methadone) ° °[address]      °[place], [place] [address]      °336-882-2125      ° °[address] °[place], [place] °852-3033 ° °[facility] (Outpatient/Inpatient, [facility])    °(insurance only) [phone]      °       °[facility] (Groups & Residential) °[place], [place] °336-389-1413 ° °   °[facility]     °[address]     °[place], [place]      °[phone]      ° °[facility] (for caregivers and family) °[address] °[place], [place] °336-299-4655 ° ° ° ° ° °Residential Treatment Programs °[facility]      °[address], [place], [place] [address]  °[phone]      ° °[facility] °[address] [place], [place] [address] °[phone] ° °[facility]        °336-248-8914      ° °[facility] °1-800-659-3381 ° °[facility]             °[address]                                         °[place], [place]                                                °877-615-2722 or [phone]                              ° °[facility] [place] °[address] °[place], [address] °[phone] ° °[facility]    °[address]      °[place], [place]     °[phone]      ° °[facility] °[address] °[place], [place] °336-285-9073 ° °[facility]   °[address]     °[place], [place] [address]     °336-899-1550      °Admissions: 8am-3pm M-F ° °[facility] °[address] °[place],  [place] °336-227-7417 ° °[facility]: Residential Program (90 Days)   °[place], [place]      °336-725-8389 or [phone]    ° °[facility] °[place], [place] °(Walk in Hours over the weekend or by referral) ° °[facility] °[address], [place],  [address] °([phone]) [phone] ° °Crisis Mobile: [facility]:  1-877-626-1772 (for crisis response 24 hours a day) °[facility] Hotline:      1-800-256-2452 °Outpatient Psychiatry and Counseling ° °[facility]: Mobile Crisis   Management 24 hours:  1-877-626-1772 ° °[facility] sliding scale fee and walk in schedule: M-F 8am-12pm/1pm-3pm °[address]  °[place], [place] [address] °[phone] ° °[facility] °[address] °[place], [place] [address] °336-703-9650 ° °[facility] ([facility])- new patient walk-in appointments available Monday - Friday 8am -3pm.          °[address] °[place], [place] [address] °336-676-6840 or crisis line- 336-676-6905 ° °[facility] Intensive Outpatient Therapy Program °[address] °[place], [place] [address] °336-832-9804 ° °[facility]                  °Crisis Services      °[phone]      °[address]     °[place], [place] [address]                ° °[facility]   °[facility] °[phone] °[address] °[place], [place] [address] ° ° °[facility]          °[address]  °[place], [place] [address]       °([phone] ° °[facility] °[address] °[place], [place] [address] °336-292-1510 ° °[facility]    °[address]    °[place], [place] [address]     °336-632-3505      ° °Parish McKinney, MD     °[address]     °[place] [address]     °336-282-1251     °  °[facility] °[address] °[place] [address] ° °[facility]     °[address]     °[place], [place]      °336-542-2076      ° °[facility] °Shamsher Ahluwalia, MD °[address] °[place], [place] [address] °336-420-9558 ° °[facility]     °[address]     °[place], [place] [address]     °336-274-1237      ° °[facility] °[address] °[place], [place] [address] °336-854-4450 °Resources for Temporary Residential Assistance/Crisis Centers ° °DAY CENTERS °[facility] °M-F 8am-3pm   °[address] [place], [place] [address]   336-332-0824 °Services include: laundry, barbering, support groups, case management, phone  & computer access, showers, AA/NA mtgs, mental health/substance abuse nurse, job skills class, disability information, VA assistance, spiritual classes, etc.  ° °HOMELESS SHELTERS ° °[facility]     °[facility]   °[address], [place]     °336.271.5959       °       °[facility] (women and children)       °[address] °[place], [place] [address] °336-275-0820 °[email or web address] for application and process °Application Required ° °[facility]   °[address]    °[place] [address]     °[phone]       °             °[facility] °[address] °,  [address] °336.273.5572 °336-235-0363(schedule application appt.) °Application Required ° °[facility] (women only)    °[address]     °[place],  [address]     °336-884-1039      °  Intake starts 6pm daily Need valid ID, SSC, & Police report [employer] [address] [place], [place] [phone] Application Required  [facility] (men only)     [address]      [place], [place]     [phone]       [facility] (Pregnant women only) [address] [place], [place] [phone]  [facility]      [address]      , [place] [address]     [phone]             [facility] [address] [place], [place] [phone] 90 day commitment/SA/Application process  [facility](men only)     [address]     [place], [place]     [phone]       Check-in at [facility] [address] [place], [place] [address] [phone] Men/Women/Women and Children must be there by 7 pm  [facility] [place], [place] [phone]                  Suicide Resources  Who to Call Call 911 National Suicide Prevention Hotline - Dial 988   [facility] at (320)600-4714; [phone]  More Resources Suicide Awareness Voices of Education       (813)687-1513        www.save.org [employer] Mental Illness(NAMI)       (800) 950-NAMI        www.nami.org American Association of Suicidology       [phone]        www.suicidology.org
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
## 2021-09-06 ENCOUNTER — Other Ambulatory Visit: Payer: Self-pay

## 2021-09-06 DIAGNOSIS — F909 Attention-deficit hyperactivity disorder, unspecified type: Secondary | ICD-10-CM

## 2021-09-06 DIAGNOSIS — I10 Essential (primary) hypertension: Secondary | ICD-10-CM

## 2021-09-06 MED ORDER — AMPHETAMINE-DEXTROAMPHETAMINE 20 MG PO TABS: 20.0000 mg | ORAL_TABLET | Freq: Every morning | ORAL | 0 refills | Status: DC

## 2021-09-06 MED ORDER — IRBESARTAN 300 MG PO TABS: 300.0000 mg | ORAL_TABLET | Freq: Every day | ORAL | 3 refills | Status: DC

## 2021-09-11 ENCOUNTER — Telehealth (HOSPITAL_COMMUNITY): Payer: Self-pay

## 2021-09-11 NOTE — BH Assessment (Signed)
Care Management - [facility]UC Follow Up Discharges  ° °Writer attempted to make contact with patient today and was unsuccessful.  Writer left a HIPPA compliant voice message.  ° °Per chart review, patient was provided with outpatient resources. ° °

## 2021-09-16 ENCOUNTER — Other Ambulatory Visit: Payer: Self-pay | Admitting: Physician Assistant

## 2021-09-16 DIAGNOSIS — I10 Essential (primary) hypertension: Secondary | ICD-10-CM

## 2021-10-05 ENCOUNTER — Other Ambulatory Visit: Payer: Self-pay

## 2021-10-05 DIAGNOSIS — I10 Essential (primary) hypertension: Secondary | ICD-10-CM

## 2021-10-05 DIAGNOSIS — F909 Attention-deficit hyperactivity disorder, unspecified type: Secondary | ICD-10-CM

## 2021-10-06 MED ORDER — IRBESARTAN 300 MG PO TABS: 300.0000 mg | ORAL_TABLET | Freq: Every day | ORAL | 3 refills | Status: DC

## 2021-10-06 MED ORDER — AMLODIPINE BESYLATE 10 MG PO TABS: 10.0000 mg | ORAL_TABLET | Freq: Every day | ORAL | 3 refills | Status: DC

## 2021-10-06 MED ORDER — AMPHETAMINE-DEXTROAMPHETAMINE 20 MG PO TABS: 20.0000 mg | ORAL_TABLET | Freq: Every morning | ORAL | 0 refills | Status: DC

## 2021-10-25 ENCOUNTER — Ambulatory Visit (INDEPENDENT_AMBULATORY_CARE_PROVIDER_SITE_OTHER)
Admission: RE | Admit: 2021-10-25 | Discharge: 2021-10-25 | Disposition: A | Discharge status: Home / Self Care | Payer: BC Managed Care – PPO | Source: Ambulatory Visit | Attending: Nurse Practitioner | Admitting: Nurse Practitioner

## 2021-10-25 ENCOUNTER — Encounter: Payer: Self-pay | Admitting: Nurse Practitioner

## 2021-10-25 ENCOUNTER — Ambulatory Visit: Payer: BC Managed Care – PPO | Admitting: Nurse Practitioner

## 2021-10-25 VITALS — BP 122/76 | HR 93 | Temp 97.5°F | Resp 14 | Ht 67.0 in | Wt 162.5 lb

## 2021-10-25 DIAGNOSIS — K219 Gastro-esophageal reflux disease without esophagitis: Secondary | ICD-10-CM | POA: Diagnosis not present

## 2021-10-25 DIAGNOSIS — Z23 Encounter for immunization: Secondary | ICD-10-CM | POA: Diagnosis not present

## 2021-10-25 DIAGNOSIS — R748 Abnormal levels of other serum enzymes: Secondary | ICD-10-CM

## 2021-10-25 DIAGNOSIS — I10 Essential (primary) hypertension: Secondary | ICD-10-CM

## 2021-10-25 DIAGNOSIS — G4709 Other insomnia: Secondary | ICD-10-CM

## 2021-10-25 DIAGNOSIS — M79642 Pain in left hand: Secondary | ICD-10-CM

## 2021-10-25 DIAGNOSIS — Z1211 Encounter for screening for malignant neoplasm of colon: Secondary | ICD-10-CM

## 2021-10-25 DIAGNOSIS — F901 Attention-deficit hyperactivity disorder, predominantly hyperactive type: Secondary | ICD-10-CM

## 2021-10-25 MED ORDER — OMEPRAZOLE 20 MG PO CPDR: 20.0000 mg | DELAYED_RELEASE_CAPSULE | Freq: Every day | ORAL | 1 refills | Status: DC

## 2021-10-25 NOTE — Assessment & Plan Note (Signed)
Patient currently maintained on omeprazole 20 mg daily.  States does well does not have to modify his diet in regards to GERD.  No upper GI performed per patient report

## 2021-10-25 NOTE — Assessment & Plan Note (Signed)
Patient currently maintained on irbesartan and amlodipine.  Blood pressure within normal limits today.  Continue taking medications as prescribed

## 2021-10-25 NOTE — Assessment & Plan Note (Addendum)
Patient currently maintained on 0.25 mg - 0.5 mg triazolam.  Patient states has had a sleep study in the past that was negative for sleep apnea.  Has tried many many medications without good result.

## 2021-10-25 NOTE — Assessment & Plan Note (Signed)
Mass to the left dorsum of hand at the base of the thumb.  Slightly movable.  Nontender to palpation nonrigid nonerythematous.  Obtain x-ray of hand likely a cyst

## 2021-10-25 NOTE — Patient Instructions (Signed)
Nice to see you today I will be in touch with the labs once I have the results. Follow up with me in 6 months for your physical, sooner if you need me  We can do the second shingles vaccine at your physical

## 2021-10-25 NOTE — Assessment & Plan Note (Signed)
Currently maintained on Adderall 20 mg IR 1 daily.

## 2021-10-25 NOTE — Assessment & Plan Note (Signed)
Noted on previous blood work.  Patient does drink alcohol pending labs today

## 2021-10-25 NOTE — Progress Notes (Signed)
New Patient Office Visit  Subjective    Patient ID: Logan Bruce, male    DOB: 02-05-1966  Age: 56 y.o. MRN: [id number]  CC:  Chief Complaint  Patient presents with   Establish Care    Previous PCP with [facility]   Medication Refill   knot on wrist    Left, x several month, pain worse at night. Thinks it may be arthritis    HPI Logan Bruce presents to establish care   HTN: States that he is on amlodipine and irbesartan. Has a cuff will check it on occasion.   GERD: has been on omeprazole. Takes the [employer] and does not  History of being followed with GI provider.   ADHD: currently on adderrall 20mg  IR.  Tolerating medication well.  Denies adverse reactions or palpitations or any increase in difficulty sleeping.  HLD: States that he does some side work. Does a lot of walking.   Insomnia: States that he has been on triazolam. States that he will try one tablet if he does not go to sleep he will take a second tablet. Has been evaluated for OSA. States that it was negative.  Has tried several things. This is working   Wrist: states it has been there several months. Feels like it has grown. Hurts intermittent. No injury. Pain is described as a nerve. Rubbing has helped   Colonoscopy: Ordered today Shingles: Update today Tdap: Up-to-date due 2025 Outpatient Encounter Medications as of 10/25/2021  Medication Sig   amLODipine (NORVASC) 10 MG tablet Take 1 tablet (10 mg total) by mouth daily.   amphetamine-dextroamphetamine (ADDERALL) 20 MG tablet Take 1 tablet (20 mg total) by mouth in the morning.   irbesartan (AVAPRO) 300 MG tablet Take 1 tablet (300 mg total) by mouth daily. Have patient monitor bp, bring all reading to next visit   triazolam (HALCION) 0.25 MG tablet Take 2 tablets (0.5 mg total) by mouth at bedtime as needed for sleep.   [DISCONTINUED] omeprazole (PRILOSEC) 20 MG capsule Take 1 capsule (20 mg total) by mouth daily.    omeprazole (PRILOSEC) 20 MG capsule Take 1 capsule (20 mg total) by mouth daily.   [DISCONTINUED] ALPRAZolam (XANAX) 0.25 MG tablet Take 1 tablet (0.25 mg total) by mouth at bedtime.   [DISCONTINUED] azithromycin (ZITHROMAX) 250 MG tablet Take 2 tabs PO x 1 dose, then 1 tab PO QD x 4 days   [DISCONTINUED] potassium chloride SA (KLOR-CON) 20 MEQ tablet Take 2 tablets (40 mEq total) by mouth daily. Take with apple juice   [DISCONTINUED] promethazine-dextromethorphan (PROMETHAZINE-DM) 6.25-15 MG/5ML syrup Take 5 mLs by mouth 4 (four) times daily as needed for cough.   No facility-administered encounter medications on file as of 10/25/2021.    Past Medical History:  Diagnosis Date   Acid reflux    ADHD    Anxiety    Diverticulitis    Hypertension    Insomnia     Past Surgical History:  Procedure Laterality Date   KNEE SURGERY Left    x3 reconstruction-[facility] 10/27/2021 Ortho    Family History  Problem Relation Age of Onset   Coronary artery disease Mother        had arteries cleaned out   Hepatitis C Mother    Other Father        had pancreatic tumor, lung tumor   Congestive Heart Failure Maternal Grandmother    Heart attack Maternal Grandfather    Stomach cancer Maternal  Grandfather     Social History   Socioeconomic History   Marital status: Married    Spouse name: Not on file   Number of children: 2   Years of education: Not on file   Highest education level: Not on file  Occupational History   Not on file  Tobacco Use   Smoking status: Never   Smokeless tobacco: Current    Types: Snuff  Vaping Use   Vaping Use: Never used  Substance and Sexual Activity   Alcohol use: Yes    Alcohol/week: 42.0 standard drinks of alcohol    Types: 42 Cans of beer per week    Comment: a week. Depends on the day 6-15   Drug use: No   Sexual activity: Not on file  Other Topics Concern   Not on file  Social History Narrative   Retired: Administrator, arts.     Social Determinants of Health   Financial Resource Strain: Not on file  Food Insecurity: Not on file  Transportation Needs: Not on file  Physical Activity: Not on file  Stress: Not on file  Social Connections: Not on file  Intimate Partner Violence: Not on file    Review of Systems  Constitutional:  Negative for chills and fever.  Respiratory:  Negative for shortness of breath.   Cardiovascular:  Negative for chest pain and leg swelling.  Gastrointestinal:  Positive for heartburn. Negative for abdominal pain, constipation, diarrhea, nausea and vomiting.       BM daily  Genitourinary:  Negative for dysuria and hematuria.  Neurological:  Negative for tingling and headaches.  Psychiatric/Behavioral:  Negative for hallucinations and suicidal ideas.         Objective    BP 122/76   Pulse 93   Temp (!) 97.5 F (36.4 C)   Resp 14   Ht 5\' 7"  (1.702 m)   Wt 162 lb 8 oz (73.7 kg)   SpO2 97%   BMI 25.45 kg/m   Physical Exam Vitals and nursing note reviewed.  Constitutional:      Appearance: Normal appearance.  HENT:     Right Ear: Tympanic membrane, ear canal and external ear normal.     Left Ear: Tympanic membrane, ear canal and external ear normal.     Mouth/Throat:     Mouth: Mucous membranes are moist.     Pharynx: Oropharynx is clear.  Eyes:     Extraocular Movements: Extraocular movements intact.     Pupils: Pupils are equal, round, and reactive to light.  Cardiovascular:     Rate and Rhythm: Normal rate and regular rhythm.     Pulses: Normal pulses.     Heart sounds: Normal heart sounds.  Pulmonary:     Effort: Pulmonary effort is normal.     Breath sounds: Normal breath sounds.  Abdominal:     General: Bowel sounds are normal. There is no distension.     Palpations: There is no mass.     Tenderness: There is no abdominal tenderness.     Hernia: No hernia is present.  Musculoskeletal:     Left hand: Deformity present. No swelling.       Arms:      Right lower leg: No edema.     Left lower leg: No edema.  Lymphadenopathy:     Cervical: No cervical adenopathy.  Skin:    General: Skin is warm.  Neurological:     General: No focal deficit present.  Mental Status: He is alert.     Deep Tendon Reflexes:     Reflex Scores:      Bicep reflexes are 2+ on the right side and 2+ on the left side.      Patellar reflexes are 1+ on the right side and 1+ on the left side.    Comments: Bilateral upper and lower extremity 5/5  Psychiatric:        Mood and Affect: Mood normal.        Behavior: Behavior normal.        Thought Content: Thought content normal.        Judgment: Judgment normal.         Assessment & Plan:   Problem List Items Addressed This Visit       Cardiovascular and Mediastinum   Essential hypertension    Patient currently maintained on irbesartan and amlodipine.  Blood pressure within normal limits today.  Continue taking medications as prescribed        Digestive   Gastroesophageal reflux disease without esophagitis    Patient currently maintained on omeprazole 20 mg daily.  States does well does not have to modify his diet in regards to GERD.  No upper GI performed per patient report      Relevant Medications   omeprazole (PRILOSEC) 20 MG capsule     Other   Attention deficit hyperactivity disorder (ADHD)    Currently maintained on Adderall 20 mg IR 1 daily.      Other insomnia    Patient currently maintained on 0.25 mg - 0.5 mg triazolam.  Patient states has had a sleep study in the past that was negative for sleep apnea.  Has tried many many medications without good result.      Elevated liver enzymes    Noted on previous blood work.  Patient does drink alcohol pending labs today      Relevant Orders   Comprehensive metabolic panel   Pain of left hand    Mass to the left dorsum of hand at the base of the thumb.  Slightly movable.  Nontender to palpation nonrigid nonerythematous.  Obtain x-ray  of hand likely a cyst      Relevant Orders   DG Hand Complete Left   Other Visit Diagnoses     Need for shingles vaccine    -  Primary   Relevant Orders   Varicella-zoster vaccine IM (Completed)   Screening for colon cancer       Relevant Orders   Ambulatory referral to Gastroenterology       Return in about 6 months (around 04/27/2022) for CPE and labs.   Audria Nine, NP

## 2021-10-26 LAB — COMPREHENSIVE METABOLIC PANEL
ALT: 34 U/L (ref 0–53)
AST: 35 U/L (ref 0–37)
Albumin: 4.6 g/dL (ref 3.5–5.2)
Alkaline Phosphatase: 64 U/L (ref 39–117)
BUN: 18 mg/dL (ref 6–23)
CO2: 25 mEq/L (ref 19–32)
Calcium: 10.1 mg/dL (ref 8.4–10.5)
Chloride: 99 mEq/L (ref 96–112)
Creatinine, Ser: 1.05 mg/dL (ref 0.40–1.50)
GFR: 80.61 mL/min (ref >60.00)
Glucose, Bld: 91 mg/dL (ref 70–99)
Potassium: 4.3 mEq/L (ref 3.5–5.1)
Sodium: 136 mEq/L (ref 135–145)
Total Bilirubin: 0.7 mg/dL (ref 0.2–1.2)
Total Protein: 7.9 g/dL (ref 6.0–8.3)

## 2021-11-02 ENCOUNTER — Telehealth: Payer: Self-pay | Admitting: Nurse Practitioner

## 2021-11-02 ENCOUNTER — Other Ambulatory Visit: Payer: Self-pay | Admitting: Nurse Practitioner

## 2021-11-02 DIAGNOSIS — F909 Attention-deficit hyperactivity disorder, unspecified type: Secondary | ICD-10-CM

## 2021-11-02 MED ORDER — AMPHETAMINE-DEXTROAMPHETAMINE 20 MG PO TABS: 20.0000 mg | ORAL_TABLET | Freq: Every day | ORAL | 0 refills | Status: DC

## 2021-11-02 MED ORDER — AMPHETAMINE-DEXTROAMPHETAMINE 20 MG PO TABS: 20.0000 mg | ORAL_TABLET | Freq: Every morning | ORAL | 0 refills | Status: DC

## 2021-11-02 NOTE — Telephone Encounter (Signed)
Caller Name: Cade Dashner Call back phone #: [phone]  MEDICATION(S): amphetamine-dextroamphetamine (ADDERALL) 20 MG tablet  Days of Med Remaining: has 3 pills left  Has the patient contacted their pharmacy (YES/NO)? No, controlled What did pharmacy advise?  [facility] [address]. He said his insurance is requiring a PA so he wants to make sure it gets sent in so he can hopefully have it before the weekend  ~~~Please advise patient/caregiver to allow 2-3 business days to process RX refills.

## 2021-11-02 NOTE — Telephone Encounter (Signed)
Refills sent to pharmacy. 

## 2021-11-02 NOTE — Telephone Encounter (Signed)
Spoke to the pharmacist. PA is not needed just need a new RX. Last filled on 10/06/21 by previous PCP with [facility] [place]

## 2021-12-06 ENCOUNTER — Other Ambulatory Visit: Payer: Self-pay | Admitting: Nurse Practitioner

## 2021-12-06 ENCOUNTER — Other Ambulatory Visit: Payer: Self-pay | Admitting: Physician Assistant

## 2021-12-06 DIAGNOSIS — G4709 Other insomnia: Secondary | ICD-10-CM

## 2021-12-06 DIAGNOSIS — F909 Attention-deficit hyperactivity disorder, unspecified type: Secondary | ICD-10-CM

## 2021-12-06 NOTE — Telephone Encounter (Signed)
Caller Name: Mubashir Mallek Call back phone #: [phone]  MEDICATION(S):  amphetamine-dextroamphetamine (ADDERALL) 20 MG tablet triazolam (HALCION) 0.25 MG tablet  Days of Med Remaining: 0  Has the patient contacted their pharmacy (YES/NO)? NO What did pharmacy advise?   [facility] [place]   ~~~Please advise patient/caregiver to allow 2-3 business days to process RX refills.

## 2021-12-07 ENCOUNTER — Telehealth: Payer: Self-pay

## 2021-12-07 MED ORDER — TRIAZOLAM 0.25 MG PO TABS: 0.5000 mg | ORAL_TABLET | Freq: Every evening | ORAL | 0 refills | Status: DC | PRN

## 2021-12-07 NOTE — Telephone Encounter (Signed)
Pt called stating he went to pharmacy, amphetamine-dextroamphetamine (ADDERALL) 20 MG tablet was refilled but triazolam (HALCION) 0.25 MG tablet was not. Pt stated the pharmacy said they'd send over a request on yesterday evening, 12/06/21. Pt is wondering will it be refilled today since he doesn't have any meds left? Call back # is [phone].

## 2021-12-07 NOTE — Telephone Encounter (Signed)
Please review. Triazolam request went to previous provider for refill request originally. Patient advised of this. I confirmed that patient is taking 0.25mg  2 tablets at bedtime as needed. Some nights he may only take 1 so he would like to keep directions at 2 tablets please

## 2021-12-07 NOTE — Telephone Encounter (Signed)
PA for Adderall 20 mg started via covermymeds. Pending decision Key: [id number] - PA Case ID: [id number] - Rx #: [id number]

## 2021-12-07 NOTE — Addendum Note (Signed)
Addended by: Kris Mouton on: 12/07/2021 10:59 AM   Modules accepted: Orders

## 2021-12-08 NOTE — Telephone Encounter (Signed)
PA approved 12/07/21-12/07/24. Pharmacy advised via fax

## 2022-01-06 ENCOUNTER — Telehealth: Payer: Self-pay | Admitting: Nurse Practitioner

## 2022-01-06 NOTE — Telephone Encounter (Signed)
Left detailed message letting patient know information below

## 2022-01-06 NOTE — Telephone Encounter (Signed)
Called pharmacy he had a prescription on file. They are getting it ready for him

## 2022-01-06 NOTE — Telephone Encounter (Signed)
  Encourage patient to contact the pharmacy for refills or they can request refills through [place]:  Please schedule appointment if longer than 1 year  NEXT APPOINTMENT DATE:  MEDICATION:amphetamine-dextroamphetamine (ADDERALL) 20 MG tablet  Is the patient out of medication?   [id number] [place] [place]   Let patient know to contact pharmacy at the end of the day to make sure medication is ready.  Please notify patient to allow 48-72 hours to process  CLINICAL FILLS OUT ALL BELOW:   LAST REFILL:  QTY:  REFILL DATE:    OTHER COMMENTS:    Okay for refill?  Please advise

## 2022-01-11 ENCOUNTER — Ambulatory Visit (INDEPENDENT_AMBULATORY_CARE_PROVIDER_SITE_OTHER): Payer: BC Managed Care – PPO

## 2022-01-11 ENCOUNTER — Ambulatory Visit
Admission: EM | Admit: 2022-01-11 | Discharge: 2022-01-11 | Disposition: A | Discharge status: Home / Self Care | Payer: BC Managed Care – PPO | Attending: Emergency Medicine | Admitting: Emergency Medicine

## 2022-01-11 DIAGNOSIS — I10 Essential (primary) hypertension: Secondary | ICD-10-CM | POA: Diagnosis not present

## 2022-01-11 DIAGNOSIS — S61201A Unspecified open wound of left index finger without damage to nail, initial encounter: Secondary | ICD-10-CM

## 2022-01-11 DIAGNOSIS — M7989 Other specified soft tissue disorders: Secondary | ICD-10-CM

## 2022-01-11 DIAGNOSIS — M79642 Pain in left hand: Secondary | ICD-10-CM | POA: Diagnosis not present

## 2022-01-11 DIAGNOSIS — S61209A Unspecified open wound of unspecified finger without damage to nail, initial encounter: Secondary | ICD-10-CM

## 2022-01-11 MED ORDER — DEXAMETHASONE SODIUM PHOSPHATE 10 MG/ML IJ SOLN
10.0000 mg | Freq: Once | INTRAMUSCULAR | Status: AC
  Administered 2022-01-11: 10 mg via INTRAMUSCULAR

## 2022-01-11 MED ORDER — CEPHALEXIN 500 MG PO CAPS: 500.0000 mg | ORAL_CAPSULE | Freq: Four times a day (QID) | ORAL | 0 refills | Status: DC

## 2022-01-11 NOTE — ED Triage Notes (Signed)
Patient to Urgent Care with complaints of left hand swelling that started today. Reports that the hand is very painful, denies any known injury. Reports working under a house yesterday- potential spider bite. Small cut present to index finger-no drainage.  Has been icing the area, drinking cherry juice.

## 2022-01-11 NOTE — Discharge Instructions (Addendum)
You were given an injection of a steroid called dexamethasone.  Take the antibiotic as directed.  Follow up with your primary care provider tomorrow.    Your blood pressure is elevated today at 149/101; repeat 146/100.  Please have this rechecked by your primary care provider in 2-4 weeks.

## 2022-01-11 NOTE — ED Provider Notes (Signed)
Logan Bruce    CSN: [id number] Arrival date & time: 01/11/22  1539      History   Chief Complaint Chief Complaint  Patient presents with   Hand Problem    HPI Logan Bruce is a 56 y.o. male.  Patient presents with pain and swelling of his left hand since early morning today.  The swelling and pain are worse at the base of his thumb but he has generalized edema of his hand.  He has an open sore on his index finger x 2-3 days.  No other wounds.  No fever, chills, wound drainage, or other symptoms.  Treatment at home with Tylenol, ice, cherry juice.  His medical history includes hypertension.   The history is provided by the patient and medical records.    Past Medical History:  Diagnosis Date   Acid reflux    ADHD    Anxiety    Diverticulitis    Hypertension    Insomnia     Patient Active Problem List   Diagnosis Date Noted   Elevated liver enzymes 10/25/2021   Pain of left hand 10/25/2021   Family discord 08/18/2021   Essential hypertension 06/10/2019   Attention deficit hyperactivity disorder (ADHD) 06/10/2019   Other insomnia 06/10/2019   Gastroesophageal reflux disease without esophagitis 06/10/2019   Tendinitis of thumb 06/10/2019   Mixed hyperlipidemia 06/10/2019   History of prediabetes 06/10/2019    Past Surgical History:  Procedure Laterality Date   KNEE SURGERY Left    x3 reconstruction-[facility]       Home Medications    Prior to Admission medications   Medication Sig Start Date End Date Taking? Authorizing Provider  cephALEXin (KEFLEX) 500 MG capsule Take 1 capsule (500 mg total) by mouth 4 (four) times daily. 01/11/22  Yes Mickie Bail, NP  amLODipine (NORVASC) 10 MG tablet Take 1 tablet (10 mg total) by mouth daily. 10/06/21   Joni Reining, PA-C  amphetamine-dextroamphetamine (ADDERALL) 20 MG tablet Take 1 tablet (20 mg total) by mouth in the morning. 11/02/21   Eden Emms, NP   amphetamine-dextroamphetamine (ADDERALL) 20 MG tablet Take 1 tablet (20 mg total) by mouth daily. 11/02/21   Eden Emms, NP  amphetamine-dextroamphetamine (ADDERALL) 20 MG tablet Take 1 tablet (20 mg total) by mouth daily. 11/02/21   Eden Emms, NP  irbesartan (AVAPRO) 300 MG tablet Take 1 tablet (300 mg total) by mouth daily. Have patient monitor bp, bring all reading to next visit 10/06/21   Joni Reining, PA-C  omeprazole (PRILOSEC) 20 MG capsule Take 1 capsule (20 mg total) by mouth daily. 10/25/21   Eden Emms, NP  triazolam (HALCION) 0.25 MG tablet Take 2 tablets (0.5 mg total) by mouth at bedtime as needed for sleep. 12/07/21   Eden Emms, NP    Family History Family History  Problem Relation Age of Onset   Coronary artery disease Mother        had arteries cleaned out   Hepatitis C Mother    Other Father        had pancreatic tumor, lung tumor   Congestive Heart Failure Maternal Grandmother    Heart attack Maternal Grandfather    Stomach cancer Maternal Grandfather     Social History Social History   Tobacco Use   Smoking status: Never   Smokeless tobacco: Current    Types: Snuff  Vaping Use   Vaping Use: Never used  Substance  Use Topics   Alcohol use: Yes    Alcohol/week: 42.0 standard drinks of alcohol    Types: 42 Cans of beer per week    Comment: a week. Depends on the day 6-15   Drug use: No     Allergies   Patient has no known allergies.   Review of Systems Review of Systems  Constitutional:  Negative for chills and fever.  Musculoskeletal:  Positive for arthralgias and joint swelling.  Skin:  Positive for wound. Negative for color change.  Neurological:  Negative for weakness and numbness.  All other systems reviewed and are negative.    Physical Exam Triage Vital Signs ED Triage Vitals  Enc Vitals Group     BP      Pulse      Resp      Temp      Temp src      SpO2      Weight      Height      Head Circumference      Peak  Flow      Pain Score      Pain Loc      Pain Edu?      Excl. in [place]?    No data found.  Updated Vital Signs BP (!) 146/100   Pulse 100   Temp 97.9 F (36.6 C)   Resp 18   SpO2 98%   Visual Acuity Right Eye Distance:   Left Eye Distance:   Bilateral Distance:    Right Eye Near:   Left Eye Near:    Bilateral Near:     Physical Exam Vitals and nursing note reviewed.  Constitutional:      General: He is not in acute distress.    Appearance: He is well-developed. He is not ill-appearing.  HENT:     Mouth/Throat:     Mouth: Mucous membranes are moist.  Cardiovascular:     Rate and Rhythm: Normal rate and regular rhythm.     Heart sounds: Normal heart sounds.  Pulmonary:     Effort: Pulmonary effort is normal. No respiratory distress.     Breath sounds: Normal breath sounds.  Musculoskeletal:        General: Swelling and tenderness present. No deformity.     Cervical back: Neck supple.     Comments: Left hand: tender to palpation at base of thumb; generalized edema; wound on index finger; decrease ROM of thumb due to pain and edema.  Brisk capillary refill, sensation intact, 2+ radial pulse, strength 5/5.  See pictures.    Skin:    General: Skin is warm and dry.     Capillary Refill: Capillary refill takes less than 2 seconds.     Findings: Lesion present.  Neurological:     General: No focal deficit present.     Mental Status: He is alert and oriented to person, place, and time.     Sensory: No sensory deficit.     Motor: No weakness.  Psychiatric:        Mood and Affect: Mood normal.        Behavior: Behavior normal.         UC Treatments / Results  Labs (all labs ordered are listed, but only abnormal results are displayed) Labs Reviewed - No data to display  EKG   Radiology DG Hand Complete Left  Result Date: 01/11/2022 CLINICAL DATA:  Pain and swelling today.  No known injury. EXAM: LEFT HAND -  COMPLETE 3+ VIEW COMPARISON:  Hand radiograph  10/25/2021 FINDINGS: There is no evidence of fracture or dislocation. Moderate to advanced arthropathy of the thumb at the carpal metacarpal joint with joint space narrowing, subchondral cystic change and fragmented osteophytes. Minimal degenerative spurring of the thumb at the metacarpal phalangeal joint. No erosions or periostitis. No soft tissue gas or radiopaque foreign body. Possible soft tissue edema the thenar eminence. IMPRESSION: 1. Moderate to advanced arthropathy of the thumb at the carpometacarpal joint, likely osteoarthritis. This is unchanged from August exam. 2. Possible soft tissue edema of the thenar eminence. Electronically Signed   By: Narda Rutherford M.D.   On: 01/11/2022 16:44    Procedures Procedures (including critical care time)  Medications Ordered in UC Medications  dexamethasone (DECADRON) injection 10 mg (10 mg Intramuscular Given 01/11/22 1659)    Initial Impression / Assessment and Plan / UC Course  I have reviewed the triage vital signs and the nursing notes.  Pertinent labs & imaging results that were available during my care of the patient were reviewed by me and considered in my medical decision making (see chart for details).   Pain and swelling of left hand.  Open wound on left index finger. Elevated blood pressure with hypertension.  X-ray soft tissue edema and osteoarthritis but no acute bony abnormality.  Because patient has a wound on his finger along with the acute pain and swelling, treated with cephalexin.  Also given injection of dexamethasone.  Patient to follow-up with his PCP tomorrow.  Also discussed that his blood pressure is elevated today and needs to be rechecked by his PCP.  Education provided on hand pain and on managing hypertension.  He agrees to plan of care.    Final Clinical Impressions(s) / UC Diagnoses   Final diagnoses:  Swelling of left hand  Left hand pain  Open wound of finger, initial encounter  Elevated blood pressure reading  in office with diagnosis of hypertension     Discharge Instructions      You were given an injection of a steroid called dexamethasone.  Take the antibiotic as directed.  Follow up with your primary care provider tomorrow.    Your blood pressure is elevated today at 149/101; repeat 146/100.  Please have this rechecked by your primary care provider in 2-4 weeks.          ED Prescriptions     Medication Sig Dispense Auth. Provider   cephALEXin (KEFLEX) 500 MG capsule Take 1 capsule (500 mg total) by mouth 4 (four) times daily. 28 capsule Mickie Bail, NP      I have reviewed the PDMP during this encounter.   Mickie Bail, NP 01/11/22 1710

## 2022-01-20 ENCOUNTER — Other Ambulatory Visit: Payer: Self-pay | Admitting: Nurse Practitioner

## 2022-01-20 DIAGNOSIS — G4709 Other insomnia: Secondary | ICD-10-CM

## 2022-01-20 MED ORDER — TRIAZOLAM 0.25 MG PO TABS: 0.5000 mg | ORAL_TABLET | Freq: Every evening | ORAL | 0 refills | Status: DC | PRN

## 2022-01-20 NOTE — Telephone Encounter (Signed)
  Encourage patient to contact the pharmacy for refills or they can request refills through [facility]  Did the patient contact the pharmacy: No  LAST APPOINTMENT DATE: 10/25/2021  NEXT APPOINTMENT DATE: N/A  MEDICATION: triazolam (HALCION) 0.25 MG tablet   omeprazole (PRILOSEC) 20 MG capsule   Is the patient out of medication? YES  PHARMACY: [id number] [place], [place] - [address]   Let patient know to contact pharmacy at the end of the day to make sure medication is ready.  Please notify patient to allow 48-72 hours to process

## 2022-01-20 NOTE — Telephone Encounter (Signed)
RX for Omeprazole should still have refill on file with the pharmacy. I will check. Please review request for Triazolam.

## 2022-01-20 NOTE — Telephone Encounter (Signed)
Left detailed message on voicemail-ok per DPR on file, Omeprazole has a refill left and Triazolam refilled

## 2022-02-01 ENCOUNTER — Ambulatory Visit: Payer: BC Managed Care – PPO | Admitting: Nurse Practitioner

## 2022-02-07 ENCOUNTER — Other Ambulatory Visit: Payer: Self-pay | Admitting: Nurse Practitioner

## 2022-02-07 DIAGNOSIS — F909 Attention-deficit hyperactivity disorder, unspecified type: Secondary | ICD-10-CM

## 2022-02-07 MED ORDER — AMPHETAMINE-DEXTROAMPHETAMINE 20 MG PO TABS: 20.0000 mg | ORAL_TABLET | Freq: Every day | ORAL | 0 refills | Status: DC

## 2022-02-07 MED ORDER — AMPHETAMINE-DEXTROAMPHETAMINE 20 MG PO TABS: 20.0000 mg | ORAL_TABLET | Freq: Every morning | ORAL | 0 refills | Status: DC

## 2022-02-07 NOTE — Telephone Encounter (Signed)
Caller Name: Bessie  Call back phone #: [phone]  MEDICATION(S):  amphetamine-dextroamphetamine (ADDERALL) 20 MG tablet   Days of Med Remaining: 0  Has the patient contacted their pharmacy (YES/NO)? NO What did pharmacy advise?   Preferred Pharmacy:  [facility] [place]   ~~~Please advise patient/caregiver to allow 2-3 business days to process RX refills.

## 2022-03-08 ENCOUNTER — Telehealth: Payer: Self-pay | Admitting: Nurse Practitioner

## 2022-03-08 NOTE — Telephone Encounter (Signed)
  Encourage patient to contact the pharmacy for refills or they can request refills through [facility]  Did the patient contact the pharmacy: no   LAST APPOINTMENT DATE:10/25/21  NEXT APPOINTMENT DATE:  MEDICATION:triazolam (HALCION) 0.25 MG tablet   amphetamine-dextroamphetamine (ADDERALL) 20 MG tablet  Is the patient out of medication? no  If not, how much is left? 2 pills left  Is this a 90 day supply: (30)   (60)  PHARMACY: [id number] [place], [place] - [address] Phone: [phone]  Fax: [phone]      Let patient know to contact pharmacy at the end of the day to make sure medication is ready.  Please notify patient to allow 48-72 hours to process

## 2022-03-09 ENCOUNTER — Other Ambulatory Visit: Payer: Self-pay | Admitting: Nurse Practitioner

## 2022-03-09 DIAGNOSIS — G4709 Other insomnia: Secondary | ICD-10-CM

## 2022-03-09 MED ORDER — TRIAZOLAM 0.25 MG PO TABS: 0.5000 mg | ORAL_TABLET | Freq: Every evening | ORAL | 0 refills | Status: DC | PRN

## 2022-03-09 NOTE — Telephone Encounter (Signed)
Patient needs to call the pharmacy before calling us. They can request refills. I sent in triazolam and he should have 2 scripts of adderall at the pharmacy

## 2022-03-09 NOTE — Telephone Encounter (Signed)
Left message to return call to our office.  

## 2022-03-10 NOTE — Telephone Encounter (Signed)
Left message to return call to our office. My chart message sent as well.

## 2022-03-11 NOTE — Telephone Encounter (Signed)
Left message to return call to our office.  

## 2022-03-30 ENCOUNTER — Other Ambulatory Visit: Payer: Self-pay | Admitting: Nurse Practitioner

## 2022-03-30 DIAGNOSIS — K219 Gastro-esophageal reflux disease without esophagitis: Secondary | ICD-10-CM

## 2022-04-23 ENCOUNTER — Other Ambulatory Visit: Payer: Self-pay | Admitting: Nurse Practitioner

## 2022-04-23 DIAGNOSIS — G4709 Other insomnia: Secondary | ICD-10-CM

## 2022-04-25 NOTE — Telephone Encounter (Signed)
Patient called in regarding the status of this medication

## 2022-04-25 NOTE — Telephone Encounter (Signed)
LVM for patient to callback and schedule.  

## 2022-05-04 NOTE — Telephone Encounter (Signed)
Patient has been scheduled.

## 2022-05-09 ENCOUNTER — Encounter: Payer: Self-pay | Admitting: Nurse Practitioner

## 2022-05-09 ENCOUNTER — Ambulatory Visit: Payer: BC Managed Care – PPO | Admitting: Nurse Practitioner

## 2022-05-09 VITALS — BP 146/92 | HR 84 | Temp 98.8°F | Resp 16 | Ht 67.0 in | Wt 166.4 lb

## 2022-05-09 DIAGNOSIS — Z87898 Personal history of other specified conditions: Secondary | ICD-10-CM | POA: Diagnosis not present

## 2022-05-09 DIAGNOSIS — F909 Attention-deficit hyperactivity disorder, unspecified type: Secondary | ICD-10-CM

## 2022-05-09 DIAGNOSIS — I10 Essential (primary) hypertension: Secondary | ICD-10-CM

## 2022-05-09 DIAGNOSIS — M778 Other enthesopathies, not elsewhere classified: Secondary | ICD-10-CM | POA: Diagnosis not present

## 2022-05-09 DIAGNOSIS — E663 Overweight: Secondary | ICD-10-CM | POA: Diagnosis not present

## 2022-05-09 DIAGNOSIS — G4709 Other insomnia: Secondary | ICD-10-CM

## 2022-05-09 DIAGNOSIS — Z125 Encounter for screening for malignant neoplasm of prostate: Secondary | ICD-10-CM

## 2022-05-09 DIAGNOSIS — Z0000 Encounter for general adult medical examination without abnormal findings: Secondary | ICD-10-CM

## 2022-05-09 DIAGNOSIS — Z23 Encounter for immunization: Secondary | ICD-10-CM

## 2022-05-09 DIAGNOSIS — E782 Mixed hyperlipidemia: Secondary | ICD-10-CM

## 2022-05-09 DIAGNOSIS — R748 Abnormal levels of other serum enzymes: Secondary | ICD-10-CM

## 2022-05-09 MED ORDER — AMPHETAMINE-DEXTROAMPHETAMINE 20 MG PO TABS: 20.0000 mg | ORAL_TABLET | Freq: Every morning | ORAL | 0 refills | Status: DC

## 2022-05-09 MED ORDER — AMPHETAMINE-DEXTROAMPHETAMINE 20 MG PO TABS: 20.0000 mg | ORAL_TABLET | Freq: Every day | ORAL | 0 refills | Status: DC

## 2022-05-09 MED ORDER — HYDROCHLOROTHIAZIDE 12.5 MG PO TABS: 12.5000 mg | ORAL_TABLET | Freq: Every day | ORAL | 0 refills | Status: DC

## 2022-05-09 NOTE — Assessment & Plan Note (Signed)
Patient currently maintained on triazolam 2 tablets nightly.  Continue

## 2022-05-09 NOTE — Assessment & Plan Note (Signed)
Patient still having trouble with thumb would like to see hand specialist.  Amatory referral placed today

## 2022-05-09 NOTE — Assessment & Plan Note (Signed)
Patient currently maintained on Adderall 20 mg daily.  Continue refill sent in today no red flags in office

## 2022-05-09 NOTE — Assessment & Plan Note (Signed)
Discussed age-appropriate immunizations and screening exams.  Information given about calling GI to schedule his CRC screening.  Will do prostate screening with blood work today.  Patient's tetanus vaccine is up-to-date.  Complete second shingles vaccine today.  Patient was given information at discharge about preventative healthcare maintenance with anticipatory guidance for his age range.

## 2022-05-09 NOTE — Patient Instructions (Addendum)
Nice to see you today I added on the HCTZ blood pressure medication WE completed your shingles vaccine today Follow up with me in 1 month, sooner if you need me  Dr Silvano Rusk  [address] [place], [place] [address] [phone]

## 2022-05-09 NOTE — Assessment & Plan Note (Signed)
Will check A1c today pending result

## 2022-05-09 NOTE — Assessment & Plan Note (Signed)
Patient currently maintained on amlodipine and irbesartan.  Blood pressure elevated at beginning of office visit and on repeat.  Will add HCTZ 12.5 mg to patient.  Follow-up 1 month

## 2022-05-09 NOTE — Assessment & Plan Note (Signed)
Pending metabolic panel today

## 2022-05-09 NOTE — Progress Notes (Signed)
Established Patient Office Visit  Subjective   Patient ID: Logan Bruce, male    DOB: 07-15-1965  Age: 57 y.o. MRN: [id number]  Chief Complaint  Patient presents with   Medication Refill      ADD: States that he is doing adderrall 20 mg daily. States that he was doing the '25mg'$  xr.States that he is not having any palpitations or increased insomnia.  Patient states he is not sure that the 20 mg is working that well anymore.  Insomnia:state that he has been taking trazolem 2 tablets at bedtime.  Tolerating it well  HTn: state that he was but has not in a while. He is on amlodpine and irbesartan.  Patient states he used to be on some diuretic in the past that really bottomed his blood pressure out.   for complete physical and follow up of chronic conditions.  Immunizations: -Tetanus: Completed in 2015 -Influenza: not up date -Shingles: Completed first one update today  -Pneumonia: too young  -HPV: aged out   Diet: [place]. Statea that he is eating approx 2-3 meals a day with snacks. Gatorade and water moslty . Sometimes soda or tea Exercise: No regular exercise. Physical work. With plumbing work   Eye exam: Completes annually. Wears glasses.  Dental exam: Completes semi-annually    Colonoscopy: Ambulatory referral was placed with patient ever followed up information given at discharge for GI office Lung Cancer Screening: N/A Dexa: N/A PSA: Due  Sleep: states that he goes to bed around 10 and will get up around 715am. Feels rested. Does snore     Review of Systems  Constitutional:  Negative for chills and fever.  Respiratory:  Negative for shortness of breath.   Cardiovascular:  Negative for chest pain and leg swelling.  Gastrointestinal:  Negative for abdominal pain, blood in stool, constipation, diarrhea, nausea and vomiting.  Genitourinary:  Negative for dysuria and hematuria.  Neurological:  Negative for tingling and headaches.  Psychiatric/Behavioral:   Negative for hallucinations and suicidal ideas.       Objective:     BP (!) 146/92   Pulse 84   Temp 98.8 F (37.1 C)   Resp 16   Ht '5\' 7"'$  (1.702 m)   Wt 166 lb 6 oz (75.5 kg)   SpO2 99%   BMI 26.06 kg/m  BP Readings from Last 3 Encounters:  05/09/22 (!) 146/92  01/11/22 (!) 146/100  10/25/21 122/76   Wt Readings from Last 3 Encounters:  05/09/22 166 lb 6 oz (75.5 kg)  10/25/21 162 lb 8 oz (73.7 kg)  04/06/21 160 lb (72.6 kg)      Physical Exam Vitals and nursing note reviewed.  Constitutional:      Appearance: Normal appearance.  HENT:     Right Ear: Tympanic membrane, ear canal and external ear normal.     Left Ear: Tympanic membrane, ear canal and external ear normal.     Mouth/Throat:     Mouth: Mucous membranes are moist.     Pharynx: Oropharynx is clear.  Eyes:     Extraocular Movements: Extraocular movements intact.     Pupils: Pupils are equal, round, and reactive to light.  Cardiovascular:     Rate and Rhythm: Normal rate and regular rhythm.     Pulses: Normal pulses.     Heart sounds: Normal heart sounds.  Pulmonary:     Effort: Pulmonary effort is normal.     Breath sounds: Normal breath sounds.  Abdominal:  General: Bowel sounds are normal. There is no distension.     Palpations: There is no mass.     Tenderness: There is no abdominal tenderness.     Hernia: No hernia is present.  Genitourinary:    Comments: Deferred per patient  Musculoskeletal:     Right lower leg: No edema.     Left lower leg: No edema.  Lymphadenopathy:     Cervical: No cervical adenopathy.  Skin:    General: Skin is warm.  Neurological:     General: No focal deficit present.     Mental Status: He is alert.     Deep Tendon Reflexes:     Reflex Scores:      Bicep reflexes are 2+ on the right side and 2+ on the left side.      Patellar reflexes are 2+ on the right side and 2+ on the left side.    Comments: Bilateral upper and lower extremity strength 5/5   Psychiatric:        Mood and Affect: Mood normal.        Behavior: Behavior normal.        Thought Content: Thought content normal.        Judgment: Judgment normal.      No results found for any visits on 05/09/22.    The 10-year ASCVD risk score (Arnett DK, et al., 2019) is: 5.1%    Assessment & Plan:   Problem List Items Addressed This Visit       Cardiovascular and Mediastinum   Essential hypertension    Patient currently maintained on amlodipine and irbesartan.  Blood pressure elevated at beginning of office visit and on repeat.  Will add HCTZ 12.5 mg to patient.  Follow-up 1 month      Relevant Medications   hydrochlorothiazide (HYDRODIURIL) 12.5 MG tablet   Other Relevant Orders   CBC   Comprehensive metabolic panel   TSH     Endocrine   History of prediabetes    Will check A1c today pending result      Relevant Orders   Hemoglobin A1c   TSH     Musculoskeletal and Integument   Tendinitis of thumb    Patient still having trouble with thumb would like to see hand specialist.  Amatory referral placed today      Relevant Orders   Ambulatory referral to Hand Surgery     Other   Attention deficit hyperactivity disorder (ADHD)    Patient currently maintained on Adderall 20 mg daily.  Continue refill sent in today no red flags in office      Relevant Medications   amphetamine-dextroamphetamine (ADDERALL) 20 MG tablet   amphetamine-dextroamphetamine (ADDERALL) 20 MG tablet   amphetamine-dextroamphetamine (ADDERALL) 20 MG tablet   Other insomnia    Patient currently maintained on triazolam 2 tablets nightly.  Continue      Preventative health care - Primary    Discussed age-appropriate immunizations and screening exams.  Information given about calling GI to schedule his CRC screening.  Will do prostate screening with blood work today.  Patient's tetanus vaccine is up-to-date.  Complete second shingles vaccine today.  Patient was given information at  discharge about preventative healthcare maintenance with anticipatory guidance for his age range.      Relevant Orders   CBC   Comprehensive metabolic panel   Mixed hyperlipidemia    Pending lipid panel today      Relevant Medications   hydrochlorothiazide (HYDRODIURIL) 12.5 MG  tablet   Other Relevant Orders   TSH   Lipid panel   Elevated liver enzymes    Pending metabolic panel today      Other Visit Diagnoses     Overweight       Relevant Orders   Hemoglobin A1c   TSH   Need for shingles vaccine       Relevant Orders   Zoster Recombinant (Shingrix ) (Completed)   Screening for prostate cancer       Relevant Orders   PSA       Return in about 4 weeks (around 06/06/2022) for BP recheck.    Romilda Garret, NP

## 2022-05-09 NOTE — Assessment & Plan Note (Signed)
Pending lipid panel today

## 2022-05-10 LAB — LIPID PANEL
Cholesterol: 200 mg/dL (ref 0–200)
HDL: 77.9 mg/dL (ref >39.00)
LDL Cholesterol: 110 mg/dL — ABNORMAL HIGH (ref 0–99)
NonHDL: 122.59
Total CHOL/HDL Ratio: 3
Triglycerides: 62 mg/dL (ref 0.0–149.0)
VLDL: 12.4 mg/dL (ref 0.0–40.0)

## 2022-05-10 LAB — COMPREHENSIVE METABOLIC PANEL
ALT: 32 U/L (ref 0–53)
AST: 35 U/L (ref 0–37)
Albumin: 4.2 g/dL (ref 3.5–5.2)
Alkaline Phosphatase: 64 U/L (ref 39–117)
BUN: 12 mg/dL (ref 6–23)
CO2: 25 mEq/L (ref 19–32)
Calcium: 9.9 mg/dL (ref 8.4–10.5)
Chloride: 99 mEq/L (ref 96–112)
Creatinine, Ser: 0.94 mg/dL (ref 0.40–1.50)
GFR: 91.72 mL/min (ref >60.00)
Glucose, Bld: 109 mg/dL — ABNORMAL HIGH (ref 70–99)
Potassium: 3.9 mEq/L (ref 3.5–5.1)
Sodium: 136 mEq/L (ref 135–145)
Total Bilirubin: 0.6 mg/dL (ref 0.2–1.2)
Total Protein: 7 g/dL (ref 6.0–8.3)

## 2022-05-10 LAB — CBC
HCT: 44 % (ref 39.0–52.0)
Hemoglobin: 15.1 g/dL (ref 13.0–17.0)
MCHC: 34.2 g/dL (ref 30.0–36.0)
MCV: 97.6 fl (ref 78.0–100.0)
Platelets: 287 10*3/uL (ref 150.0–400.0)
RBC: 4.51 Mil/uL (ref 4.22–5.81)
RDW: 12.8 % (ref 11.5–15.5)
WBC: 7 10*3/uL (ref 4.0–10.5)

## 2022-05-10 LAB — TSH: TSH: 1.44 u[IU]/mL (ref 0.35–5.50)

## 2022-05-10 LAB — PSA: PSA: 1.1 ng/mL (ref 0.10–4.00)

## 2022-05-10 LAB — HEMOGLOBIN A1C: Hgb A1c MFr Bld: 5.4 % (ref 4.6–6.5)

## 2022-05-23 ENCOUNTER — Other Ambulatory Visit: Payer: Self-pay | Admitting: Nurse Practitioner

## 2022-05-23 DIAGNOSIS — I10 Essential (primary) hypertension: Secondary | ICD-10-CM

## 2022-05-24 NOTE — Telephone Encounter (Signed)
Patient scheduled.

## 2022-05-24 NOTE — Telephone Encounter (Signed)
Pt called stating when triazolam (HALCION) 0.25 MG tablet [id number] was refilled, it was only refilled with 30 tablets. Pt stated the instructions state to take 2 tablets at night. Pt stated since only 30 tablets was prescribed instead of 60, he's now out of meds. [id number] [place] [place] [address] [id number] Call back # [phone]

## 2022-05-25 ENCOUNTER — Other Ambulatory Visit: Payer: Self-pay | Admitting: Nurse Practitioner

## 2022-05-25 DIAGNOSIS — G4709 Other insomnia: Secondary | ICD-10-CM

## 2022-05-25 MED ORDER — TRIAZOLAM 0.25 MG PO TABS: 0.5000 mg | ORAL_TABLET | Freq: Every evening | ORAL | 0 refills | Status: DC | PRN

## 2022-06-09 ENCOUNTER — Encounter: Payer: Self-pay | Admitting: Nurse Practitioner

## 2022-06-09 ENCOUNTER — Ambulatory Visit (INDEPENDENT_AMBULATORY_CARE_PROVIDER_SITE_OTHER): Payer: BC Managed Care – PPO | Admitting: Nurse Practitioner

## 2022-06-09 VITALS — BP 132/78 | HR 96 | Temp 99.2°F | Resp 16 | Ht 67.0 in | Wt 169.1 lb

## 2022-06-09 DIAGNOSIS — I10 Essential (primary) hypertension: Secondary | ICD-10-CM

## 2022-06-09 NOTE — Patient Instructions (Addendum)
Nice to see you today I will be in touch with the labs once I have them Follow up with me in 6 months, sooner if you need me  Hand specialist phone number  [phone] ext.[phone].

## 2022-06-09 NOTE — Progress Notes (Signed)
   Established Patient Office Visit  Subjective   Patient ID: Logan Bruce, male    DOB: November 23, 1965  Age: 57 y.o. MRN: [id number]  Chief Complaint  Patient presents with   Hypertension      HTN: patient was hypertensive at last office visit. He was started on HCTZ 12.5mg  and is here for a blood pressure recheck  He is currently on amlodipine 10mg , irbesartan 300 and hctz 12.5. States that he has been checking it every other day 128/83. States that he has been taking the medications as prescribed.  States for the first week to two weeks he did not feel like himself. But now back to normal now.     Review of Systems  Constitutional:  Negative for chills and fever.  Respiratory:  Negative for shortness of breath.   Cardiovascular:  Negative for chest pain.  Neurological:  Negative for headaches.      Objective:     BP 132/78   Pulse 96   Temp 99.2 F (37.3 C)   Resp 16   Ht 5\' 7"  (1.702 m)   Wt 169 lb 2 oz (76.7 kg)   SpO2 99%   BMI 26.49 kg/m    Physical Exam Vitals and nursing note reviewed.  Constitutional:      Appearance: Normal appearance.  Cardiovascular:     Rate and Rhythm: Normal rate and regular rhythm.     Heart sounds: Normal heart sounds.  Pulmonary:     Effort: Pulmonary effort is normal.     Breath sounds: Normal breath sounds.  Neurological:     Mental Status: He is alert.      No results found for any visits on 06/09/22.    The 10-year ASCVD risk score (Arnett DK, et al., 2019) is: 4.2%    Assessment & Plan:   Problem List Items Addressed This Visit       Cardiovascular and Mediastinum   Essential hypertension - Primary    Patient currently maintained on irbesartan 300 mg, amlodipine 10 mg, hydrochlorothiazide 12.5 mg.  Patient blood pressure well-controlled in office will check CMP today, pending result.      Relevant Orders   BASIC METABOLIC PANEL WITH GFR    Return in about 6 months (around 12/10/2022) for [place]  recheck .    Romilda Garret, NP

## 2022-06-09 NOTE — Assessment & Plan Note (Signed)
Patient currently maintained on irbesartan 300 mg, amlodipine 10 mg, hydrochlorothiazide 12.5 mg.  Patient blood pressure well-controlled in office will check CMP today, pending result.

## 2022-06-10 LAB — BASIC METABOLIC PANEL WITH GFR
BUN: 21 mg/dL (ref 7–25)
CO2: 23 mmol/L (ref 20–32)
Calcium: 9.8 mg/dL (ref 8.6–10.3)
Chloride: 99 mmol/L (ref 98–110)
Creat: 1.23 mg/dL (ref 0.70–1.30)
Glucose, Bld: 106 mg/dL — ABNORMAL HIGH (ref 65–99)
Potassium: 4.2 mmol/L (ref 3.5–5.3)
Sodium: 137 mmol/L (ref 135–146)
eGFR: 70 mL/min/{1.73_m2} (ref >=60)

## 2022-06-16 ENCOUNTER — Ambulatory Visit: Payer: BC Managed Care – PPO | Admitting: Sports Medicine

## 2022-06-16 ENCOUNTER — Encounter: Payer: Self-pay | Admitting: Sports Medicine

## 2022-06-16 ENCOUNTER — Other Ambulatory Visit: Payer: Self-pay

## 2022-06-16 DIAGNOSIS — M1812 Unilateral primary osteoarthritis of first carpometacarpal joint, left hand: Secondary | ICD-10-CM

## 2022-06-16 DIAGNOSIS — M79642 Pain in left hand: Secondary | ICD-10-CM

## 2022-06-16 MED ORDER — BETAMETHASONE SOD PHOS & ACET 6 (3-3) MG/ML IJ SUSP
6.0000 mg | INTRAMUSCULAR | Status: AC | PRN
  Administered 2022-06-16: 6 mg via INTRA_ARTICULAR

## 2022-06-16 MED ORDER — LIDOCAINE HCL 1 % IJ SOLN
0.5000 mL | INTRAMUSCULAR | Status: AC | PRN
  Administered 2022-06-16: .5 mL

## 2022-06-16 NOTE — Progress Notes (Signed)
Logan Bruce - 57 y.o. male MRN [id number]  Date of birth: [birth year]  Office Visit Note: Visit Date: 06/16/2022 PCP: Michela Pitcher, NP Referred by: Michela Pitcher, NP  Subjective: Chief Complaint  Patient presents with   Left Hand - Pain   HPI: Logan Bruce is a pleasant 57 y.o. male who presents today for acute on chronic left thumb pain.  Pain has bothered him since late summer of last year.  Had x-rays of the hand on 10/25/2021 as well as 01/12/2019.  Which showed advanced CMC joint arthropathy.  He does take occasional over-the-counter arthritis pills that his wife got in, otherwise no other treatments.  No pain at rest, but has pain with gripping, pain at the thumb gets manipulated, sometimes some pain at night after using it frequently.  He denies any numbness or tingling.  Pertinent ROS were reviewed with the patient and found to be negative unless otherwise specified above in HPI.   Assessment & Plan: Visit Diagnoses:  1. Primary osteoarthritis of first carpometacarpal joint of left hand   2. Pain in left hand    Plan: Discussed with Logan Bruce that he is suffering from rather advanced [facility] joint osteoarthritis.  He did have notable bossing and swelling around this area, which we did ultrasound to ensure there is no cystic formation or other space-occupying lesion to which there was not.  To start treatment options such as oral medication therapy, injection therapy, possible surgical options.  He does not wish to take medicine, proceeded with [facility] joint injection.  Similar ice and take over-the-counter inflammatories as needed for the next few days.  He will let me know how this does over the coming weeks, he will follow-up with me as needed.  Additional treatment considerations: CMC cool comfort brace/splint, repeat injection therapy, referral to hand surgeon for surgical discussion  Follow-up: Return if symptoms worsen or fail to improve, for may repeat injection q 3-12 mos as  desired.   Meds & Orders: No orders of the defined types were placed in this encounter.   Orders Placed This Encounter  Procedures   Hand/UE Inj   [place] Extrem Up Left Ltd     Procedures: Hand/UE Inj: L thumb CMC for osteoarthritis on 06/16/2022 11:25 AM Indications: joint swelling and pain Details: 25 G needle, radial approach Medications: 0.5 mL lidocaine 1 %; 6 mg betamethasone acetate-betamethasone sodium phosphate 6 (3-3) MG/ML Outcome: tolerated well, no immediate complications    Procedure, treatment alternatives, risks and benefits explained, specific risks discussed. Consent was given by the patient. Immediately prior to procedure a time out was called to verify the correct patient, procedure, equipment, support staff and site/side marked as required. Patient was prepped and draped in the usual sterile fashion.          Clinical History: No specialty comments available.  He reports that he has never smoked. His smokeless tobacco use includes snuff.  Recent Labs    05/09/22 1542  HGBA1C 5.4    Objective:   Vital Signs: There were no vitals taken for this visit.  Physical Exam  Gen: Well-appearing, in no acute distress; non-toxic CV: Well-perfused. Warm.  Resp: Breathing unlabored on room air; no wheezing. Psych: Fluid speech in conversation; appropriate affect; normal thought process Neuro: Sensation intact throughout. No gross coordination deficits.   Ortho Exam - Left hand: Evaluation of the left hand demonstrates some swelling and bony bossing over the proximal aspect of the thumb near the [facility]  joint.  Positive TTP at the [facility] joint.  Mild pain with CMC grind testing.  No UCL or RCL insufficiency, although pain with manipulation about this joint.  Intact extensor and flexor tendon testing about the thumb.  Okay sign intact.  Neurovascular intact distally.  Imaging:  [place] Extrem Up Left Ltd Limited musculoskeletal ultrasound of the left upper extremity, left thumb   and CMC joint was performed today.  Evaluation of the [facility] joint shows  significant arthritic change with bony spurring.  There is a small joint  effusion noted with hypoechoic fluid change.  Scanning the proximal  phalanx there is no cortical irregularity noted in short and long axis,  overlying extensor tendon intact.  Scanning radially there is appropriate  neurovasculature.  I cannot appreciate any space-occupying lesions or  cystic formation.  Impression: Severe CMC joint arthritis with small joint effusion and  arthritic change with bony spurring.  Hand X-ray, Left 01/11/22: Narrative & Impression  CLINICAL DATA:  Pain and swelling today.  No known injury.   EXAM: LEFT HAND - COMPLETE 3+ VIEW   COMPARISON:  Hand radiograph 10/25/2021   FINDINGS: There is no evidence of fracture or dislocation. Moderate to advanced arthropathy of the thumb at the carpal metacarpal joint with joint space narrowing, subchondral cystic change and fragmented osteophytes. Minimal degenerative spurring of the thumb at the metacarpal phalangeal joint. No erosions or periostitis. No soft tissue gas or radiopaque foreign body. Possible soft tissue edema the thenar eminence.    Past Medical/Family/Surgical/Social History: Medications & Allergies reviewed per EMR, new medications updated. Patient Active Problem List   Diagnosis Date Noted   Elevated liver enzymes 10/25/2021   Pain of left hand 10/25/2021   Family discord 08/18/2021   Essential hypertension 06/10/2019   Attention deficit hyperactivity disorder (ADHD) 06/10/2019   Other insomnia 06/10/2019   Gastroesophageal reflux disease without esophagitis 06/10/2019   Preventative health care 06/10/2019   Tendinitis of thumb 06/10/2019   Mixed hyperlipidemia 06/10/2019   History of prediabetes 06/10/2019   Past Medical History:  Diagnosis Date   Acid reflux    ADHD    Anxiety    Diverticulitis    Hypertension    Insomnia    Family  History  Problem Relation Age of Onset   Coronary artery disease Mother        had arteries cleaned out   Hepatitis C Mother    Other Father        had pancreatic tumor, lung tumor   Congestive Heart Failure Maternal Grandmother    Heart attack Maternal Grandfather    Stomach cancer Maternal Grandfather    Past Surgical History:  Procedure Laterality Date   KNEE SURGERY Left    x3 reconstruction-[facility]   Social History   Occupational History   Not on file  Tobacco Use   Smoking status: Never   Smokeless tobacco: Current    Types: Snuff  Vaping Use   Vaping Use: Never used  Substance and Sexual Activity   Alcohol use: Yes    Alcohol/week: 42.0 standard drinks of alcohol    Types: 42 Cans of beer per week    Comment: a week. Depends on the day 6-15   Drug use: No   Sexual activity: Not on file

## 2022-06-16 NOTE — Progress Notes (Signed)
No injury Pain for several months "Swelling" bulge over the thumb and dorsal side of hand Does not hurt to press on it  Pain is mainly when picking up something  Xrays with PCP back in October

## 2022-07-03 ENCOUNTER — Other Ambulatory Visit: Payer: Self-pay | Admitting: Nurse Practitioner

## 2022-07-03 DIAGNOSIS — G4709 Other insomnia: Secondary | ICD-10-CM

## 2022-07-04 ENCOUNTER — Other Ambulatory Visit: Payer: Self-pay | Admitting: Nurse Practitioner

## 2022-07-04 DIAGNOSIS — F909 Attention-deficit hyperactivity disorder, unspecified type: Secondary | ICD-10-CM

## 2022-07-04 MED ORDER — TRIAZOLAM 0.25 MG PO TABS: 0.5000 mg | ORAL_TABLET | Freq: Every evening | ORAL | 0 refills | Status: DC | PRN

## 2022-07-05 ENCOUNTER — Telehealth: Payer: Self-pay | Admitting: Nurse Practitioner

## 2022-07-05 NOTE — Telephone Encounter (Signed)
Prescription Request  07/05/2022  LOV: 06/09/2022  What is the name of the medication or equipment? amphetamine-dextroamphetamine (ADDERALL) 20 MG tablet   Have you contacted your pharmacy to request a refill? Yes   Which pharmacy would you like this sent to?  [name], [place] - [name] [place] [address] Phone: [phone] Fax: [phone]    Patient notified that their request is being sent to the clinical staff for review and that they should receive a response within 2 business days.   Please advise at Mobile [phone] (mobile)

## 2022-07-05 NOTE — Telephone Encounter (Signed)
Advised that pt has 1 refill on file at Pharmacy, and they will fill tomorrow for pt.

## 2022-07-12 ENCOUNTER — Other Ambulatory Visit: Payer: Self-pay | Admitting: Nurse Practitioner

## 2022-07-12 DIAGNOSIS — F909 Attention-deficit hyperactivity disorder, unspecified type: Secondary | ICD-10-CM

## 2022-07-14 ENCOUNTER — Other Ambulatory Visit: Payer: Self-pay | Admitting: Nurse Practitioner

## 2022-07-14 DIAGNOSIS — F909 Attention-deficit hyperactivity disorder, unspecified type: Secondary | ICD-10-CM

## 2022-07-14 MED ORDER — AMPHETAMINE-DEXTROAMPHETAMINE 20 MG PO TABS
20.0000 mg | ORAL_TABLET | Freq: Every morning | ORAL | 0 refills | Status: DC
Start: 2022-07-14 — End: 2022-11-15

## 2022-07-14 MED ORDER — AMPHETAMINE-DEXTROAMPHETAMINE 20 MG PO TABS: 20.0000 mg | ORAL_TABLET | Freq: Every day | ORAL | 0 refills | Status: DC

## 2022-07-27 ENCOUNTER — Other Ambulatory Visit: Payer: Self-pay | Admitting: Nurse Practitioner

## 2022-07-27 DIAGNOSIS — G4709 Other insomnia: Secondary | ICD-10-CM

## 2022-07-28 MED ORDER — TRIAZOLAM 0.25 MG PO TABS
0.5000 mg | ORAL_TABLET | Freq: Every evening | ORAL | 0 refills | Status: DC | PRN
Start: 2022-07-28 — End: 2022-09-14

## 2022-08-09 ENCOUNTER — Other Ambulatory Visit: Payer: Self-pay | Admitting: Nurse Practitioner

## 2022-08-09 ENCOUNTER — Other Ambulatory Visit: Payer: Self-pay | Admitting: Physician Assistant

## 2022-08-09 DIAGNOSIS — I10 Essential (primary) hypertension: Secondary | ICD-10-CM

## 2022-08-09 DIAGNOSIS — K219 Gastro-esophageal reflux disease without esophagitis: Secondary | ICD-10-CM

## 2022-08-10 ENCOUNTER — Ambulatory Visit: Payer: BC Managed Care – PPO | Admitting: Internal Medicine

## 2022-08-11 ENCOUNTER — Ambulatory Visit: Payer: BC Managed Care – PPO | Admitting: Nurse Practitioner

## 2022-08-11 ENCOUNTER — Encounter: Payer: Self-pay | Admitting: Nurse Practitioner

## 2022-08-11 VITALS — BP 136/78 | HR 98 | Temp 98.1°F | Resp 16 | Ht 67.0 in | Wt 164.0 lb

## 2022-08-11 DIAGNOSIS — R0602 Shortness of breath: Secondary | ICD-10-CM

## 2022-08-11 DIAGNOSIS — R52 Pain, unspecified: Secondary | ICD-10-CM | POA: Insufficient documentation

## 2022-08-11 DIAGNOSIS — B001 Herpesviral vesicular dermatitis: Secondary | ICD-10-CM

## 2022-08-11 DIAGNOSIS — I10 Essential (primary) hypertension: Secondary | ICD-10-CM

## 2022-08-11 DIAGNOSIS — J40 Bronchitis, not specified as acute or chronic: Secondary | ICD-10-CM

## 2022-08-11 LAB — POCT INFLUENZA A/B
Influenza A, POC: NEGATIVE
Influenza B, POC: NEGATIVE

## 2022-08-11 LAB — POC COVID19 BINAXNOW: SARS Coronavirus 2 Ag: NEGATIVE

## 2022-08-11 MED ORDER — AZITHROMYCIN 250 MG PO TABS
ORAL_TABLET | ORAL | 0 refills | Status: AC
Start: 2022-08-11 — End: 2022-08-16

## 2022-08-11 MED ORDER — VALACYCLOVIR HCL 1 G PO TABS
2000.0000 mg | ORAL_TABLET | Freq: Two times a day (BID) | ORAL | 0 refills | Status: AC
Start: 2022-08-11 — End: ?

## 2022-08-11 MED ORDER — ALBUTEROL SULFATE HFA 108 (90 BASE) MCG/ACT IN AERS
2.0000 | INHALATION_SPRAY | Freq: Four times a day (QID) | RESPIRATORY_TRACT | 0 refills | Status: DC | PRN
Start: 2022-08-11 — End: 2022-09-16

## 2022-08-11 MED ORDER — IRBESARTAN 300 MG PO TABS
300.0000 mg | ORAL_TABLET | Freq: Every day | ORAL | 3 refills | Status: DC
Start: 2022-08-11 — End: 2023-06-20

## 2022-08-11 MED ORDER — AMLODIPINE BESYLATE 10 MG PO TABS
10.0000 mg | ORAL_TABLET | Freq: Every day | ORAL | 3 refills | Status: DC
Start: 2022-08-11 — End: 2023-06-20

## 2022-08-11 NOTE — Patient Instructions (Signed)
Nice to see you today I have sent medications to the pharmacy  Follow up if you do not start improving

## 2022-08-11 NOTE — Assessment & Plan Note (Signed)
Several cold sores to upper and lower lip.  Will send in valacyclovir 2000 mg twice daily for total 1 day.  Patient was informed to avoid direct constant sunlight exposure to lips

## 2022-08-11 NOTE — Progress Notes (Signed)
Acute Office Visit  Subjective:     Patient ID: Logan Bruce, male    DOB: December 17, 1965, 57 y.o.   MRN: [id number]  Chief Complaint  Patient presents with   Nasal Congestion    Started Sunday   Cough   Dizziness    Feeling off     Patient is in today for sick symptoms with a history of HTN. GERD, pre-diabetes, HLD, ADHD  Symptoms started on Thursday. States that he started feeling poor. Took some vitamin C States on Saturday he did yard work and pressure washing the house. He woke up Sunday feeling bad  Covid vaccine: moderna Flu vaccine: not up to date States that his wife was sick the week before. States that she had to miss work  Mucinex and tylenol severe cold. States that it has helped with congestoin wise. But he still feels poor   Review of Systems  Constitutional:  Positive for malaise/fatigue. Negative for chills and fever.       Appetite is a little decreased  Fluid intake is good   HENT:  Positive for ear pain and sore throat. Negative for ear discharge.   Respiratory:  Positive for cough, shortness of breath and wheezing.   Gastrointestinal:  Negative for abdominal pain, constipation, diarrhea, nausea and vomiting.  Musculoskeletal:  Positive for joint pain and myalgias.  Neurological:  Positive for dizziness and headaches.        Objective:    BP 136/78   Pulse 98   Temp 98.1 F (36.7 C)   Resp 16   Ht 5\' 7"  (1.702 m)   Wt 164 lb (74.4 kg)   SpO2 100%   BMI 25.69 kg/m    Physical Exam Vitals and nursing note reviewed.  Constitutional:      Appearance: Normal appearance.  HENT:     Right Ear: Tympanic membrane, ear canal and external ear normal.     Left Ear: Tympanic membrane, ear canal and external ear normal.     Mouth/Throat:     Mouth: Mucous membranes are moist.  Cardiovascular:     Rate and Rhythm: Normal rate and regular rhythm.     Heart sounds: Normal heart sounds.  Pulmonary:     Effort: Pulmonary effort is normal.      Breath sounds: Normal breath sounds.  Lymphadenopathy:     Cervical: No cervical adenopathy.  Neurological:     Mental Status: He is alert.     Results for orders placed or performed in visit on 08/11/22  POC COVID-19 BinaxNow  Result Value Ref Range   SARS Coronavirus 2 Ag Negative Negative  POCT Influenza A/B  Result Value Ref Range   Influenza A, POC Negative Negative   Influenza B, POC Negative Negative        Assessment & Plan:   Problem List Items Addressed This Visit       Cardiovascular and Mediastinum   Essential hypertension   Relevant Medications   amLODipine (NORVASC) 10 MG tablet   irbesartan (AVAPRO) 300 MG tablet     Respiratory   Bronchitis    Given length of illness and exam will like to treat with azithromycin pack as directed.  Follow-up if no improvement flu and COVID test both negative in office.      Relevant Medications   azithromycin (ZITHROMAX) 250 MG tablet     Digestive   Cold sore    Several cold sores to upper and lower lip.  Will send  in valacyclovir 2000 mg twice daily for total 1 day.  Patient was informed to avoid direct constant sunlight exposure to lips      Relevant Medications   azithromycin (ZITHROMAX) 250 MG tablet   valACYclovir (VALTREX) 1000 MG tablet     Other   Body aches - Primary    Flu and COVID test in office.  Patient continues using over-the-counter analgesics as needed, rest and drink plenty of fluids.      Relevant Orders   POC COVID-19 BinaxNow (Completed)   POCT Influenza A/B (Completed)   Shortness of breath    Lungs clear to auscultation.  Patient states some wheezing last night prior to bed will send in albuterol inhaler for as needed use.      Relevant Medications   albuterol (VENTOLIN HFA) 108 (90 Base) MCG/ACT inhaler    Meds ordered this encounter  Medications   amLODipine (NORVASC) 10 MG tablet    Sig: Take 1 tablet (10 mg total) by mouth daily.    Dispense:  90 tablet    Refill:  3    irbesartan (AVAPRO) 300 MG tablet    Sig: Take 1 tablet (300 mg total) by mouth daily. Have patient monitor bp, bring all reading to next visit    Dispense:  90 tablet    Refill:  3   azithromycin (ZITHROMAX) 250 MG tablet    Sig: Take 2 tablets on day 1, then 1 tablet daily on days 2 through 5    Dispense:  6 tablet    Refill:  0    Order Specific Question:   Supervising Provider    Answer:   Roxy Manns A [[id number]]   valACYclovir (VALTREX) 1000 MG tablet    Sig: Take 2 tablets (2,000 mg total) by mouth 2 (two) times daily.    Dispense:  4 tablet    Refill:  0    Order Specific Question:   Supervising Provider    Answer:   Roxy Manns A [[id number]]   albuterol (VENTOLIN HFA) 108 (90 Base) MCG/ACT inhaler    Sig: Inhale 2 puffs into the lungs every 6 (six) hours as needed for wheezing or shortness of breath.    Dispense:  8 g    Refill:  0    Order Specific Question:   Supervising Provider    Answer:   TOWER, MARNE A [[id number]]    Return if symptoms worsen or fail to improve.  Audria Nine, NP

## 2022-08-11 NOTE — Assessment & Plan Note (Signed)
Given length of illness and exam will like to treat with azithromycin pack as directed.  Follow-up if no improvement flu and COVID test both negative in office.

## 2022-08-11 NOTE — Assessment & Plan Note (Signed)
Flu and COVID test in office.  Patient continues using over-the-counter analgesics as needed, rest and drink plenty of fluids.

## 2022-08-11 NOTE — Assessment & Plan Note (Signed)
Lungs clear to auscultation.  Patient states some wheezing last night prior to bed will send in albuterol inhaler for as needed use.

## 2022-09-13 ENCOUNTER — Other Ambulatory Visit: Payer: Self-pay | Admitting: Nurse Practitioner

## 2022-09-13 DIAGNOSIS — G4709 Other insomnia: Secondary | ICD-10-CM

## 2022-09-14 ENCOUNTER — Telehealth: Payer: Self-pay | Admitting: Nurse Practitioner

## 2022-09-14 ENCOUNTER — Other Ambulatory Visit: Payer: Self-pay | Admitting: Nurse Practitioner

## 2022-09-14 DIAGNOSIS — G4709 Other insomnia: Secondary | ICD-10-CM

## 2022-09-14 DIAGNOSIS — R0602 Shortness of breath: Secondary | ICD-10-CM

## 2022-09-14 MED ORDER — TRIAZOLAM 0.25 MG PO TABS
0.5000 mg | ORAL_TABLET | Freq: Every evening | ORAL | 0 refills | Status: DC | PRN
Start: 2022-09-14 — End: 2022-10-24

## 2022-09-14 NOTE — Telephone Encounter (Signed)
Prescription Request  09/14/2022  LOV: 08/11/2022  What is the name of the medication or equipment? triazolam (HALCION) 0.25 MG tablet   Have you contacted your pharmacy to request a refill? No   Which pharmacy would you like this sent to?  [name], [place] - [name] [employer] [place] [address] Phone: [phone] Fax: [phone]    Patient notified that their request is being sent to the clinical staff for review and that they should receive a response within 2 business days.   Please advise at Mobile [phone] (mobile)  Patient stated he is out of this medication

## 2022-09-16 NOTE — Telephone Encounter (Signed)
Medication: Albuterol HFA  Directions: Take 2 puffs Q6H PRN for wheeze or SOB Last given: 08/11/22 Number refills: 0 Last o/v: 08/11/22 Follow up: N/A Labs:  N/A

## 2022-10-05 IMAGING — CT CT ABD-PELV W/ CM
2 of 5 series · 15 of 46 positions shown, 17 images · IV contrast (APPLIED)
Comparison: 08/23/2012

CLINICAL DATA: Abdominal pain, acute, nonlocalized.

EXAM:
CT ABDOMEN AND PELVIS WITH CONTRAST
TECHNIQUE: Multidetector CT imaging of the abdomen and pelvis was performed
using the standard protocol following bolus administration of
intravenous contrast.
CONTRAST:  80mL OMNIPAQUE IOHEXOL 350 MG/ML SOLN

[Series 2: routine abd/pel with · axial · 0.70mm/px · z∈[-342,+108]mm · 12 of 102 slices shown, 14 images]
[im 6/102  soft-tissue]
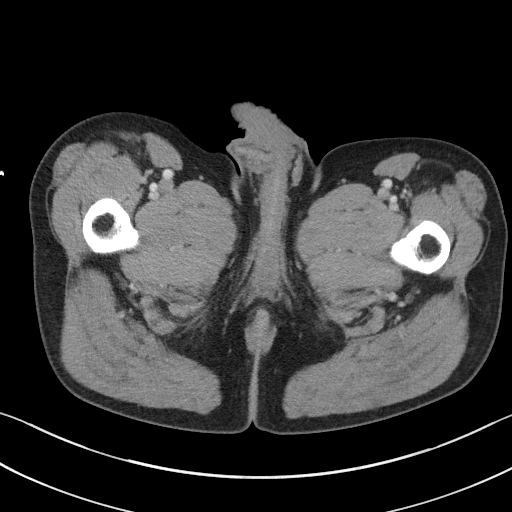
[im 6/102  bone]
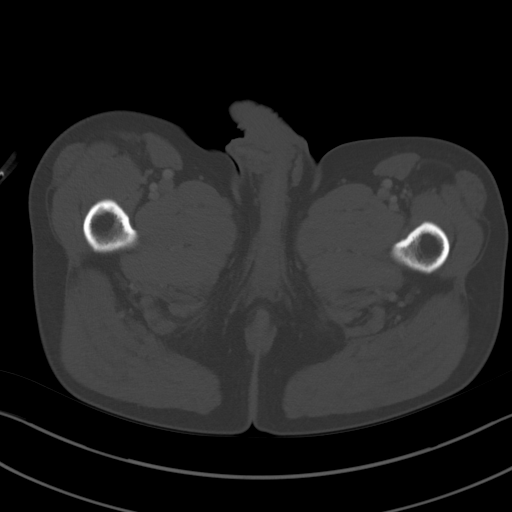
[im 16/102  soft-tissue]
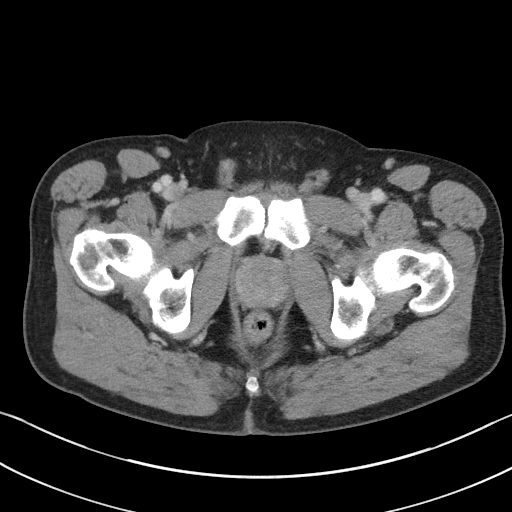
[im 22/102  soft-tissue]
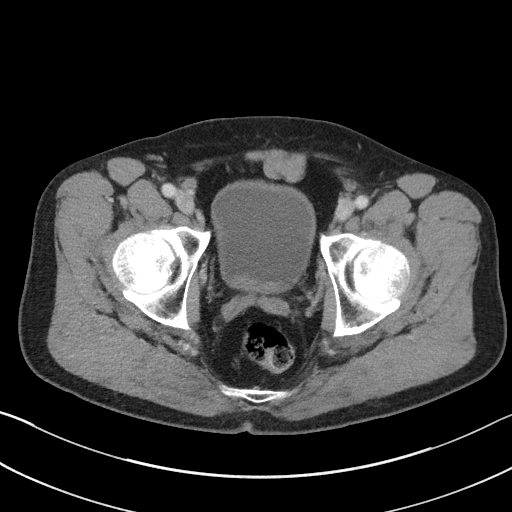
[im 32/102  soft-tissue]
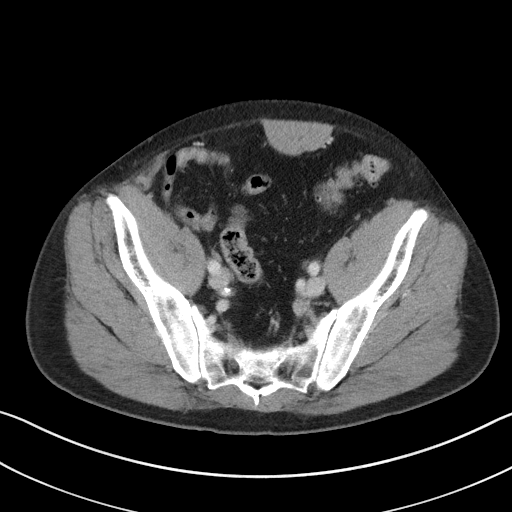
[im 38/102  soft-tissue]
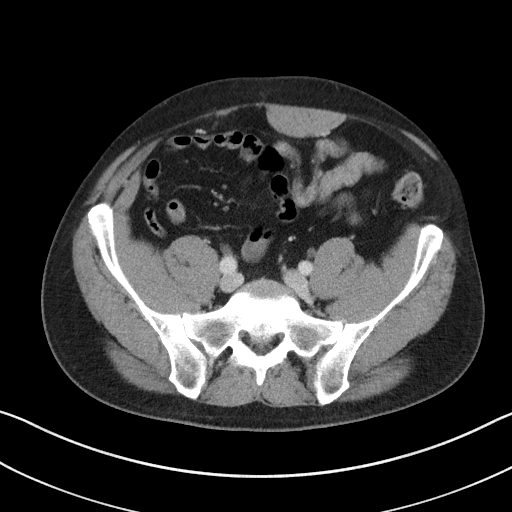
[im 48/102  soft-tissue]
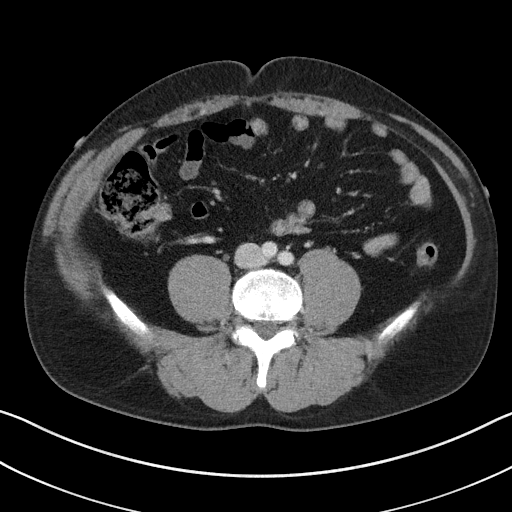
[im 54/102  soft-tissue]
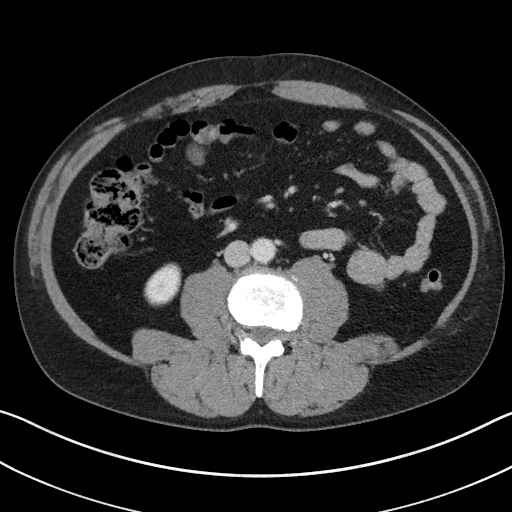
[im 64/102  soft-tissue]
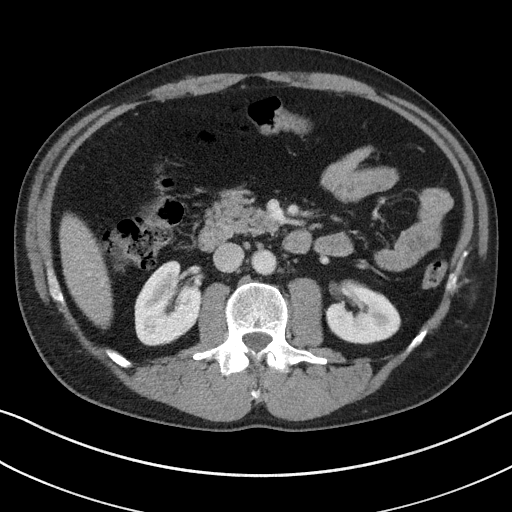
[im 70/102  soft-tissue]
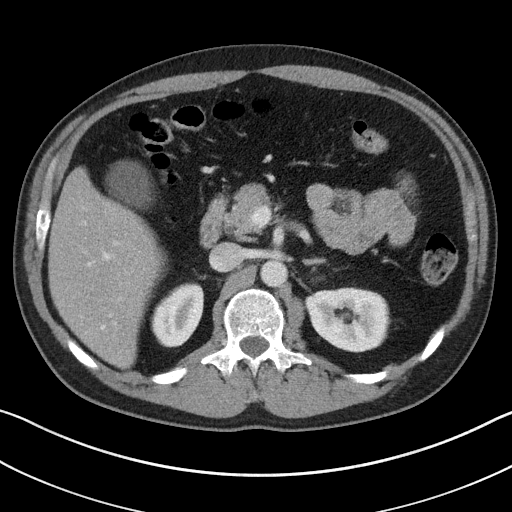
[im 70/102  bone]
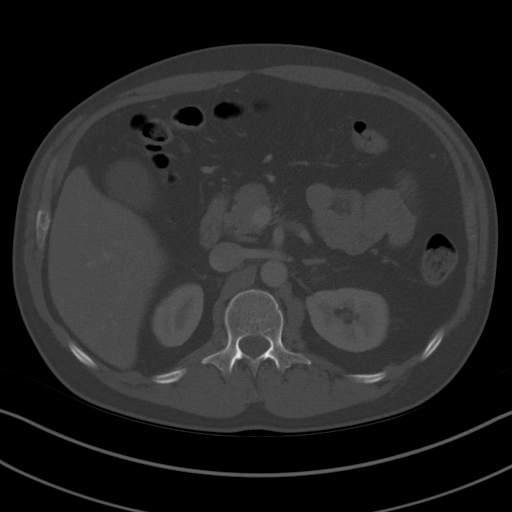
[im 80/102  soft-tissue]
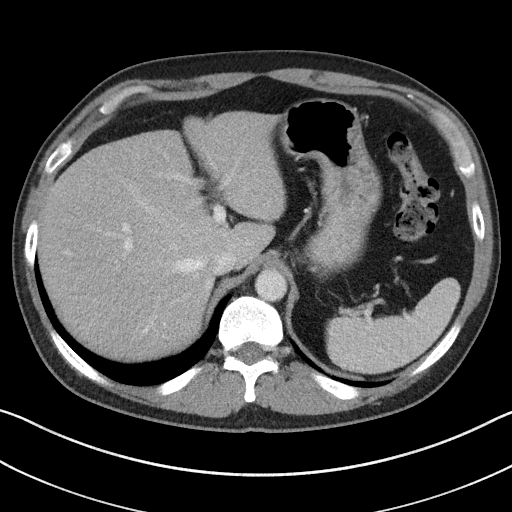
[im 86/102  soft-tissue]
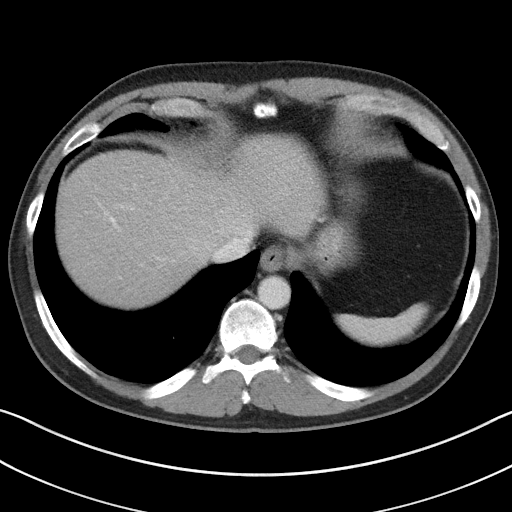
[im 96/102  soft-tissue]
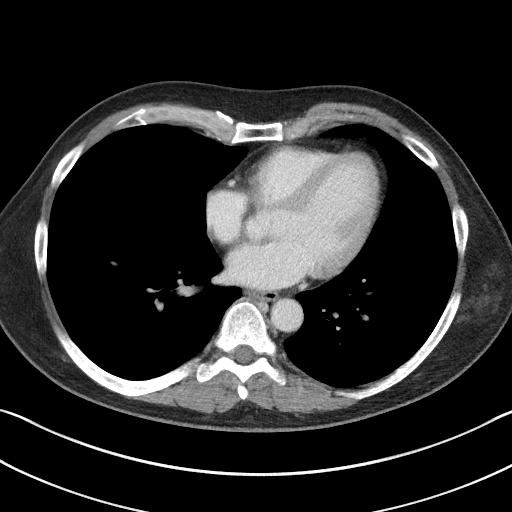

[Series 5: coronal st · coronal · 0.80mm/px · 3 of 94 slices shown]
[im 32/94  soft-tissue]
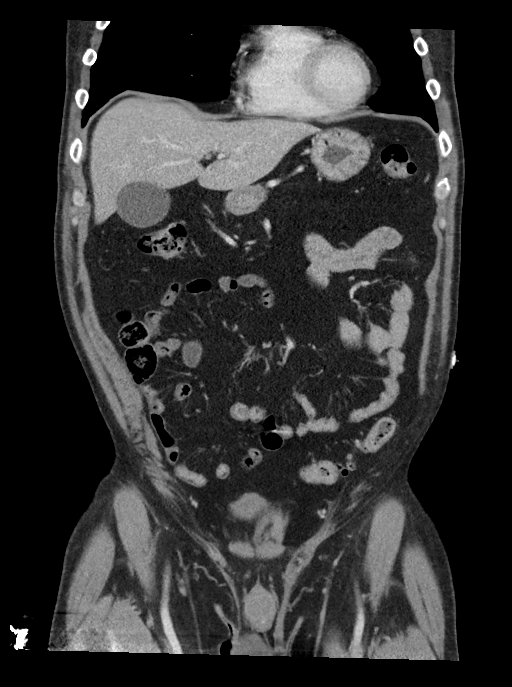
[im 42/94  soft-tissue]
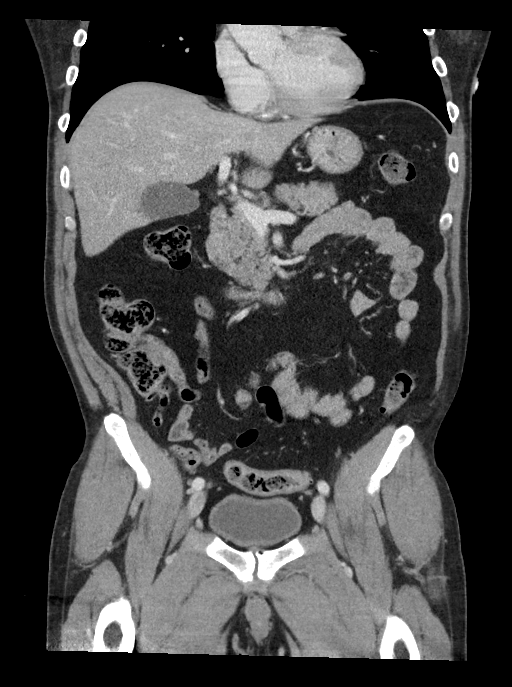
[im 52/94  soft-tissue]
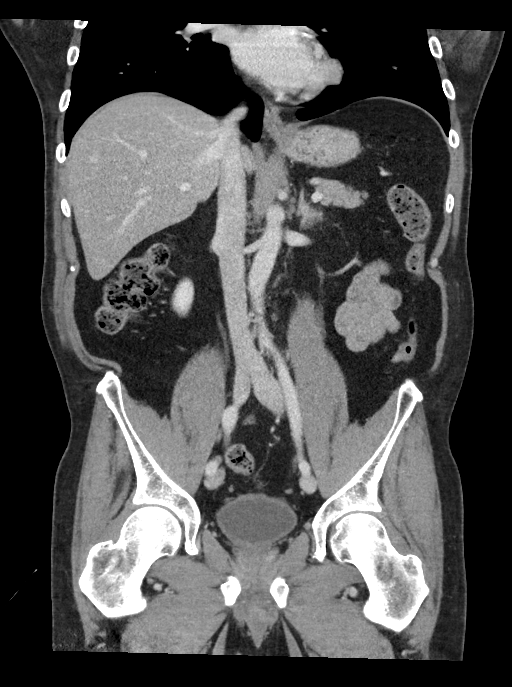

[15 of 46 positions shown; findings below may reference images not displayed]

FINDINGS: Lower chest: Lung bases are clear.  Coronary artery calcium.

Hepatobiliary: Slightly decreased attenuation of the liver and this
is similar to the previous examination. Normal appearance of the
gallbladder. Portal venous system is patent. No suspicious liver
lesions. No biliary dilatation.

Pancreas: Unremarkable. No pancreatic ductal dilatation or
surrounding inflammatory changes.

Spleen: Normal in size without focal abnormality.

Adrenals/Urinary Tract: Normal adrenal glands. Normal appearance of
both kidneys. Mild bladder wall thickening without surrounding
inflammatory changes.

Stomach/Bowel: Stomach is within normal limits. Appendix appears
normal. No evidence of bowel wall thickening, distention, or
inflammatory changes. Scattered colonic diverticula.

Vascular/Lymphatic: Coronary artery calcium. No enlarged abdominal
or pelvic lymph nodes.

Reproductive: Prostate is unremarkable.

Other: Negative for free fluid. Again noted is asymmetry of the
rectus abdominis muscles, left side greater than right. The right
rectus muscle appears to be atrophic. There is also atrophy of the
left oblique musculature. These findings are similar to the exam in
2024. Small periumbilical hernia containing fat.

Musculoskeletal: No acute bone abnormality.
IMPRESSION: 1. Mild diffuse bladder wall thickening. Findings are nonspecific
and could be chronic but this has progressed since 2024. No
inflammatory changes in this area. Recommend correlation with
urinalysis.
2. Asymmetric abdominal wall musculature with areas of atrophy.
Findings are similar to the exam in [DATE]. Small ventral hernia containing fat.
4. Coronary artery calcium. Coronary calcium is likely age advanced.

## 2022-10-05 IMAGING — US US ABDOMEN LIMITED
1 series · 14 of 25 positions shown · non-contrast
Comparison: None.

CLINICAL DATA: Right upper quadrant abdominal pain for 4 days.

EXAM:
ULTRASOUND ABDOMEN LIMITED RIGHT UPPER QUADRANT

[Series 1: us abdomen limited ruq (liver/gb) · 14 of 89 slices shown]
[im 1/89]
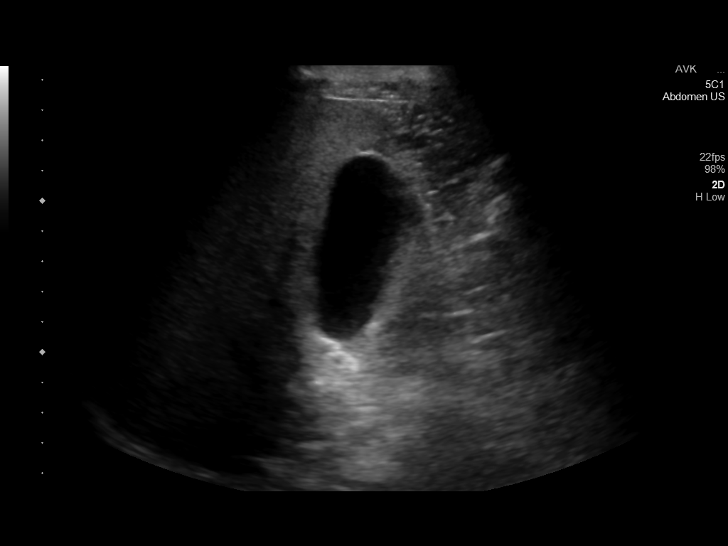
[im 8/89]
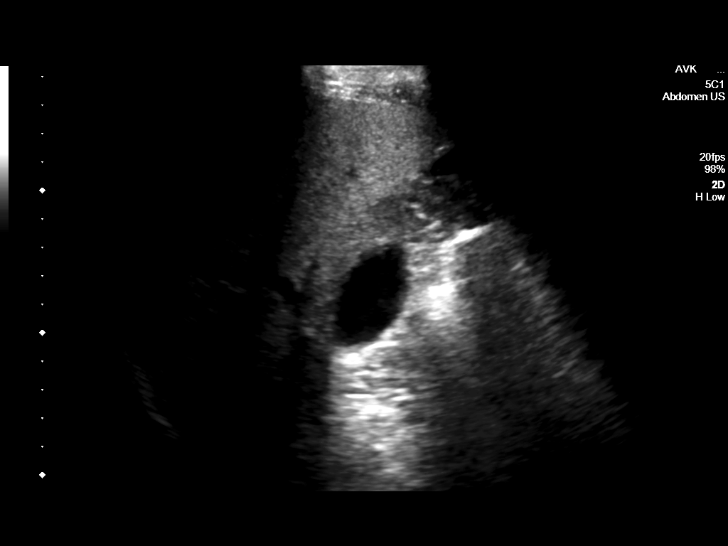
[im 15/89]
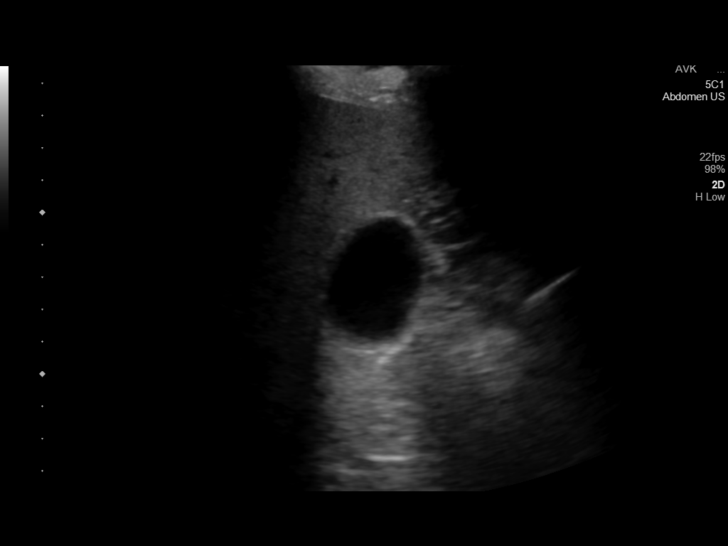
[im 23/89]
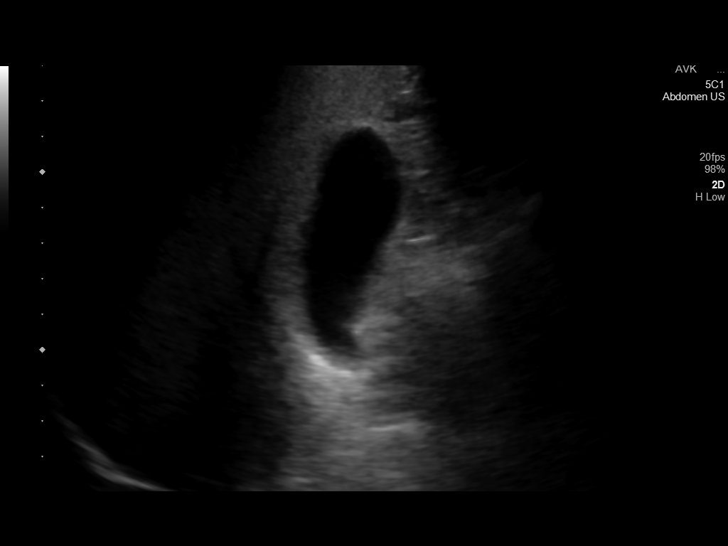
[im 30/89]
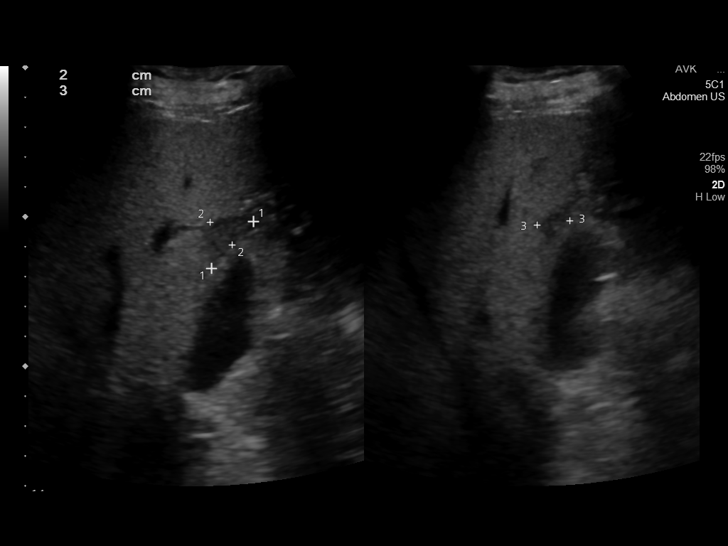
[im 34/89]
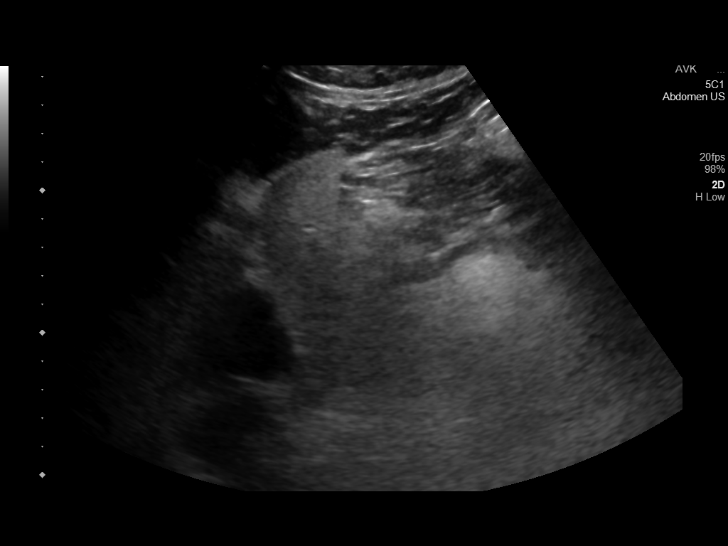
[im 41/89]
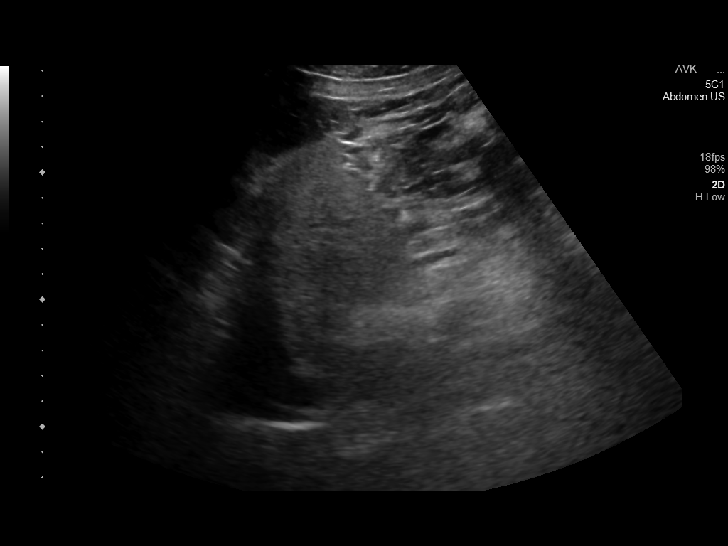
[im 48/89]
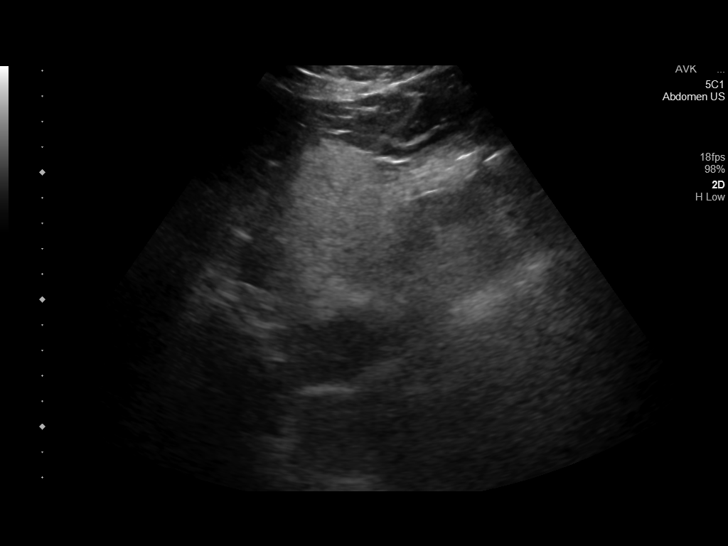
[im 56/89]
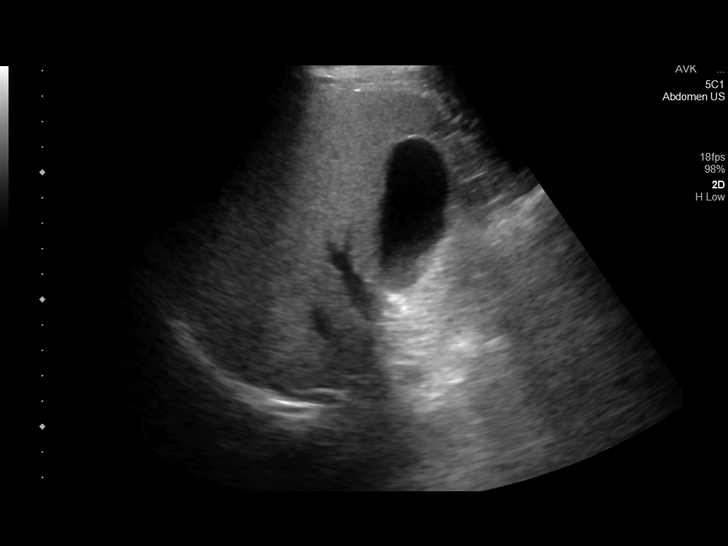
[im 59/89]
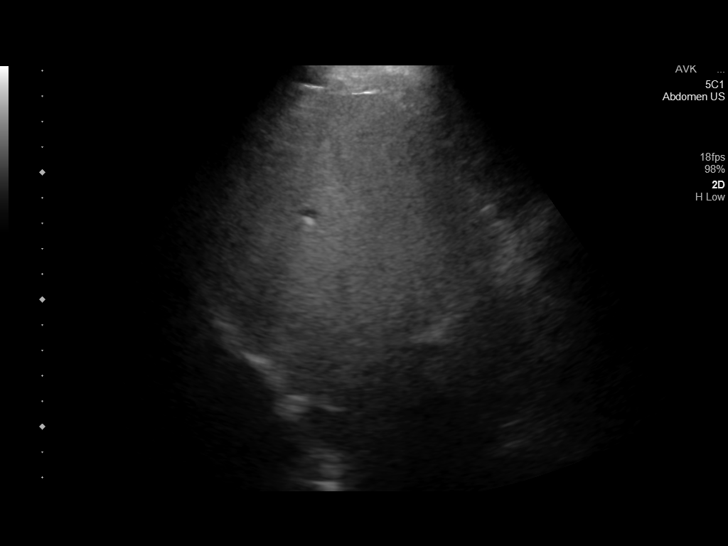
[im 67/89]
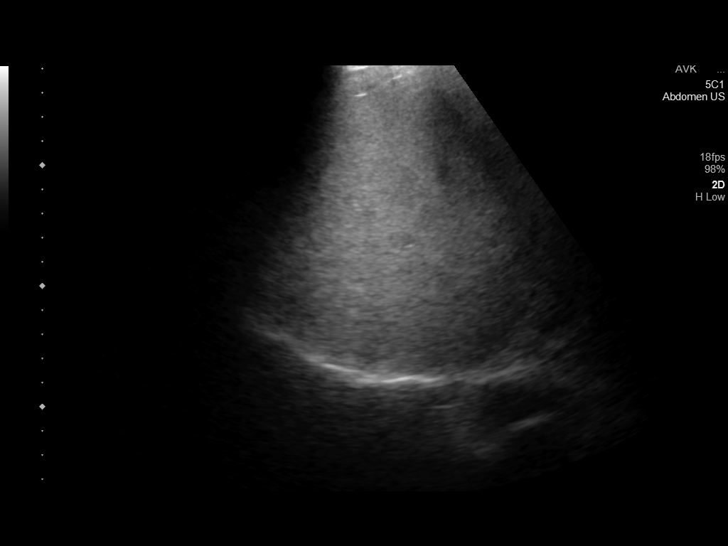
[im 74/89]
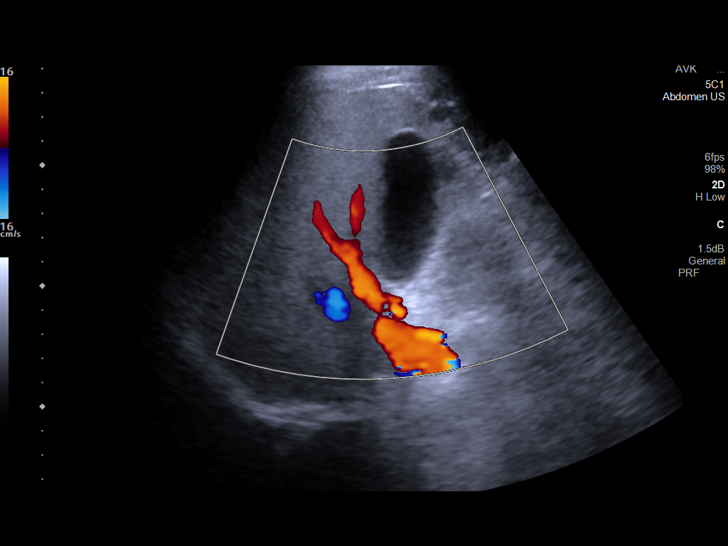
[im 81/89]
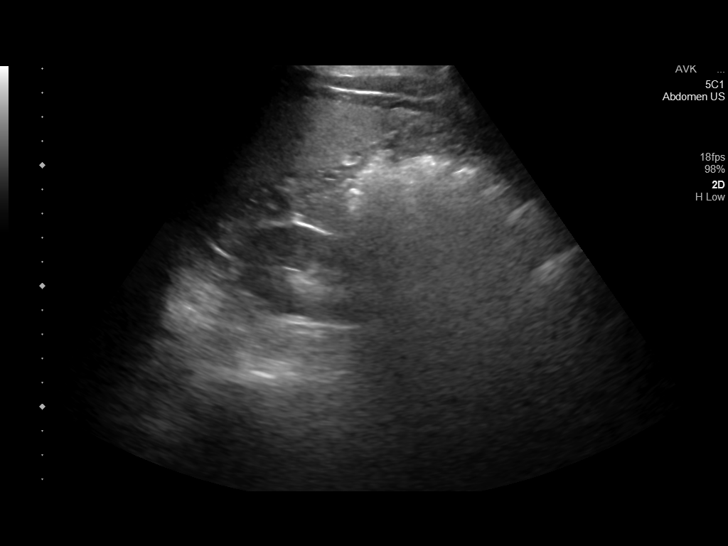
[im 89/89]
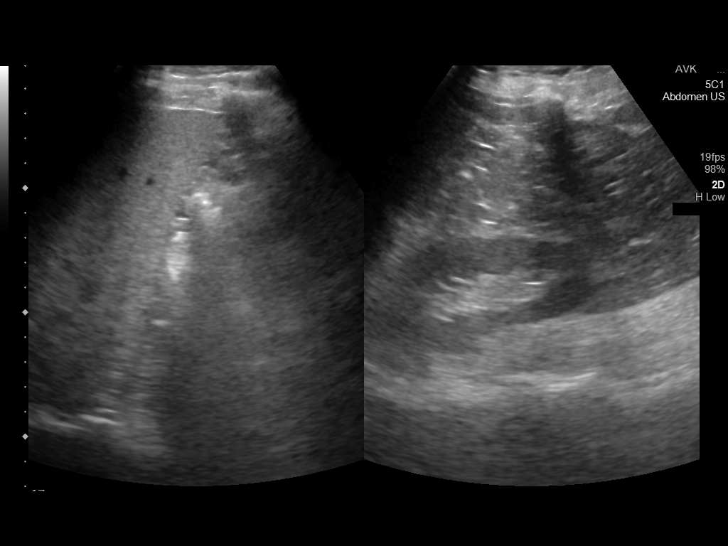

[14 of 25 positions shown; findings below may reference images not displayed]

FINDINGS: Gallbladder:

No gallstones or wall thickening visualized. No sonographic Murphy
sign noted by sonographer.

Common bile duct:

Diameter: 3.4 mm, within normal limits.

Liver:

Liver parenchyma is diffusely echogenic. No focal lesions are
present. There is focal fatty sparing adjacent to the gallbladder
fossa. Portal vein is patent on color Doppler imaging with normal
direction of blood flow towards the liver.

Other: None.
IMPRESSION: 1. Hepatic steatosis.
2. Normal sonographic appearance of the gallbladder and common bile
duct.

## 2022-10-10 ENCOUNTER — Telehealth: Payer: Self-pay | Admitting: Nurse Practitioner

## 2022-10-10 DIAGNOSIS — G4709 Other insomnia: Secondary | ICD-10-CM

## 2022-10-10 NOTE — Telephone Encounter (Signed)
Pt called stating the pharmacy only had 30 tablets in stock for the meds, triazolam (HALCION) 0.25 MG tablet, so he wasn't supplied the prescribed 60. Pt states he wanted to let Toney Reil know if he requests a refill before time, this is the reason why. Pt also needs refills on the following meds:    Prescription Request  10/10/2022  LOV: 08/11/2022  What is the name of the medication or equipment? amphetamine-dextroamphetamine (ADDERALL) 20 MG tablet [[id number]] & omeprazole (PRILOSEC) 20 MG capsule [[id number]]   Have you contacted your pharmacy to request a refill? Yes   Which pharmacy would you like this sent to?  [name], [place] - [name] [place] [address] Phone: [phone] Fax: [phone]    Patient notified that their request is being sent to the clinical staff for review and that they should receive a response within 2 business days.   Please advise at Mobile [phone] (mobile)

## 2022-10-10 NOTE — Telephone Encounter (Signed)
Called pharmacy to confirm adderall script on file. Pharmacist explained that it was on hold but will be filled. Pharmacist stated that Prilosec is on file as well.

## 2022-10-10 NOTE — Telephone Encounter (Signed)
Can we check with the pharmacy. Should have an adderall on file and refilled the omeprazole  in may for 6 months

## 2022-10-12 NOTE — Telephone Encounter (Signed)
Left message to return call to our office.  

## 2022-10-21 ENCOUNTER — Other Ambulatory Visit: Payer: Self-pay | Admitting: Nurse Practitioner

## 2022-10-21 DIAGNOSIS — R0602 Shortness of breath: Secondary | ICD-10-CM

## 2022-10-21 NOTE — Telephone Encounter (Signed)
Left message to return call to our office.  

## 2022-10-24 MED ORDER — TRIAZOLAM 0.25 MG PO TABS
0.5000 mg | ORAL_TABLET | Freq: Every evening | ORAL | 0 refills | Status: DC | PRN
Start: 2022-10-24 — End: 2022-11-29

## 2022-10-24 NOTE — Telephone Encounter (Signed)
Refill sent in

## 2022-10-24 NOTE — Addendum Note (Signed)
Addended by: Eden Emms on: 10/24/2022 01:42 PM   Modules accepted: Orders

## 2022-10-24 NOTE — Telephone Encounter (Signed)
Called patient he was able to get scripts but he is not out of Triazolam. [facility]

## 2022-10-25 ENCOUNTER — Ambulatory Visit: Payer: BC Managed Care – PPO | Admitting: Nurse Practitioner

## 2022-11-14 ENCOUNTER — Other Ambulatory Visit: Payer: Self-pay | Admitting: Nurse Practitioner

## 2022-11-14 DIAGNOSIS — F909 Attention-deficit hyperactivity disorder, unspecified type: Secondary | ICD-10-CM

## 2022-11-15 ENCOUNTER — Other Ambulatory Visit: Payer: Self-pay | Admitting: Nurse Practitioner

## 2022-11-15 DIAGNOSIS — F909 Attention-deficit hyperactivity disorder, unspecified type: Secondary | ICD-10-CM

## 2022-11-15 MED ORDER — AMPHETAMINE-DEXTROAMPHETAMINE 20 MG PO TABS
20.0000 mg | ORAL_TABLET | Freq: Every day | ORAL | 0 refills | Status: DC
Start: 2022-11-15 — End: 2023-03-23

## 2022-11-15 MED ORDER — AMPHETAMINE-DEXTROAMPHETAMINE 20 MG PO TABS
20.0000 mg | ORAL_TABLET | Freq: Every morning | ORAL | 0 refills | Status: DC
Start: 2022-11-15 — End: 2023-03-23

## 2022-11-15 MED ORDER — AMPHETAMINE-DEXTROAMPHETAMINE 20 MG PO TABS
20.0000 mg | ORAL_TABLET | Freq: Every day | ORAL | 0 refills | Status: DC
Start: 2022-11-15 — End: 2022-12-19

## 2022-11-29 ENCOUNTER — Other Ambulatory Visit: Payer: Self-pay | Admitting: Nurse Practitioner

## 2022-11-29 DIAGNOSIS — G4709 Other insomnia: Secondary | ICD-10-CM

## 2022-11-30 ENCOUNTER — Other Ambulatory Visit: Payer: Self-pay | Admitting: Nurse Practitioner

## 2022-11-30 DIAGNOSIS — I10 Essential (primary) hypertension: Secondary | ICD-10-CM

## 2022-11-30 MED ORDER — TRIAZOLAM 0.25 MG PO TABS
0.5000 mg | ORAL_TABLET | Freq: Every evening | ORAL | 0 refills | Status: DC | PRN
Start: 2022-11-30 — End: 2022-12-19

## 2022-11-30 NOTE — Telephone Encounter (Signed)
Can we schedule patient a visit per my last note in the next 30 days please

## 2022-12-01 NOTE — Telephone Encounter (Signed)
LVM to cb and schedule

## 2022-12-14 ENCOUNTER — Telehealth: Payer: Self-pay | Admitting: Nurse Practitioner

## 2022-12-14 NOTE — Telephone Encounter (Signed)
PT's wife states he will be out of medication on Friday 10/4, PT has made med refill appointment for 10/7. PT's wife asked if Susy Frizzle could possibly send refill for patient to pick up Friday when he is out? Advised patient's wife that due to medication being a controlled substance, Susy Frizzle may not fill this until med refill appointment.   Prescription Request  12/14/2022  LOV: 08/11/2022  What is the name of the medication or equipment? amphetamine-dextroamphetamine (ADDERALL) 20 MG tablet   Have you contacted your pharmacy to request a refill? Yes   Which pharmacy would you like this sent to?  [name], [place] - [name] [place] [address] Phone: [phone] Fax: [phone]    Patient notified that their request is being sent to the clinical staff for review and that they should receive a response within 2 business days.   Please advise at Mobile [phone] (mobile)

## 2022-12-16 ENCOUNTER — Other Ambulatory Visit: Payer: Self-pay | Admitting: Nurse Practitioner

## 2022-12-16 DIAGNOSIS — Z1211 Encounter for screening for malignant neoplasm of colon: Secondary | ICD-10-CM

## 2022-12-16 DIAGNOSIS — Z1212 Encounter for screening for malignant neoplasm of rectum: Secondary | ICD-10-CM

## 2022-12-19 ENCOUNTER — Ambulatory Visit: Payer: BC Managed Care – PPO | Admitting: Nurse Practitioner

## 2022-12-19 ENCOUNTER — Encounter: Payer: Self-pay | Admitting: Nurse Practitioner

## 2022-12-19 VITALS — BP 138/98 | HR 84 | Temp 97.8°F | Ht 67.0 in | Wt 173.0 lb

## 2022-12-19 DIAGNOSIS — G4709 Other insomnia: Secondary | ICD-10-CM

## 2022-12-19 DIAGNOSIS — F909 Attention-deficit hyperactivity disorder, unspecified type: Secondary | ICD-10-CM | POA: Diagnosis not present

## 2022-12-19 DIAGNOSIS — I10 Essential (primary) hypertension: Secondary | ICD-10-CM

## 2022-12-19 MED ORDER — TRIAZOLAM 0.25 MG PO TABS: 0.5000 mg | ORAL_TABLET | Freq: Every evening | ORAL | 2 refills | Status: DC | PRN

## 2022-12-19 MED ORDER — AMPHETAMINE-DEXTROAMPHETAMINE 20 MG PO TABS
20.0000 mg | ORAL_TABLET | Freq: Every day | ORAL | 0 refills | Status: DC
Start: 2022-12-19 — End: 2023-03-21

## 2022-12-19 MED ORDER — HYDROCHLOROTHIAZIDE 12.5 MG PO TABS
12.5000 mg | ORAL_TABLET | Freq: Every day | ORAL | 1 refills | Status: DC
Start: 2022-12-19 — End: 2023-05-26

## 2022-12-19 NOTE — Assessment & Plan Note (Signed)
Patient currently maintained on Adderall 20 mg twice daily.  Tolerates medication well.  No red flags in office today.  Did review PDMP.  Continue medication as prescribed

## 2022-12-19 NOTE — Telephone Encounter (Signed)
Pt was seen in office today. Contacted pt pharmacy. Pt has #2 scripts on file. Pharmacy staff advised that adderall will be filled.

## 2022-12-19 NOTE — Patient Instructions (Addendum)
Nice to see you today  I want to see you in 6 months for your physical and labs, sooner if you need me

## 2022-12-19 NOTE — Assessment & Plan Note (Signed)
Patient currently maintained on irbesartan, amlodipine, hydrochlorothiazide.  Blood pressure slightly above goal in office today.  Previous 2 readings in office have been within normal limits.  Continue medication as prescribed spotcheck blood pressure at home

## 2022-12-19 NOTE — Progress Notes (Signed)
Established Patient Office Visit  Subjective   Patient ID: Logan Bruce, male    DOB: Oct 30, 1965  Age: 57 y.o. MRN: [id number]  Chief Complaint  Patient presents with   Medication Refill    Ibesartan, amlodopine, and adderall.        ADHD: states that he is still taking the adderrall and tolerating it well. States that he is sleeping his normal . States that he has a few morning where he gets up earlier than his normal. But not trouble sleeping outside of his normal difficulty   HTN: states that he does not check blood pressure at home now. He has the ability to. He is tolerating the medications well. He states that he just took the blood pressure pills approx 30 mins prior to the office visit      Review of Systems  Constitutional:  Negative for chills and fever.  Respiratory:  Negative for shortness of breath.   Cardiovascular:  Negative for chest pain, palpitations and leg swelling.  Neurological:  Negative for dizziness and headaches.      Objective:     BP (!) 138/98   Pulse 84   Temp 97.8 F (36.6 C) (Oral)   Ht 5\' 7"  (1.702 m)   Wt 173 lb (78.5 kg)   SpO2 93%   BMI 27.10 kg/m  BP Readings from Last 3 Encounters:  12/19/22 (!) 138/98  08/11/22 136/78  06/09/22 132/78   Wt Readings from Last 3 Encounters:  12/19/22 173 lb (78.5 kg)  08/11/22 164 lb (74.4 kg)  06/09/22 169 lb 2 oz (76.7 kg)      Physical Exam Vitals and nursing note reviewed.  Constitutional:      Appearance: Normal appearance.  Cardiovascular:     Rate and Rhythm: Normal rate and regular rhythm.     Heart sounds: Normal heart sounds.  Pulmonary:     Effort: Pulmonary effort is normal.     Breath sounds: Normal breath sounds.  Musculoskeletal:     Right lower leg: No edema.     Left lower leg: No edema.  Neurological:     Mental Status: He is alert.      No results found for any visits on 12/19/22.    The 10-year ASCVD risk score (Arnett DK, et al., 2019) is:  5.1%    Assessment & Plan:   Problem List Items Addressed This Visit       Cardiovascular and Mediastinum   Essential hypertension - Primary    Patient currently maintained on irbesartan, amlodipine, hydrochlorothiazide.  Blood pressure slightly above goal in office today.  Previous 2 readings in office have been within normal limits.  Continue medication as prescribed spotcheck blood pressure at home      Relevant Medications   hydrochlorothiazide (HYDRODIURIL) 12.5 MG tablet     Other   Attention deficit hyperactivity disorder (ADHD)    Patient currently maintained on Adderall 20 mg twice daily.  Tolerates medication well.  No red flags in office today.  Did review PDMP.  Continue medication as prescribed      Relevant Medications   amphetamine-dextroamphetamine (ADDERALL) 20 MG tablet   Other insomnia   Relevant Medications   triazolam (HALCION) 0.25 MG tablet    Return in about 6 months (around 06/19/2023) for CPE and Labs.    Audria Nine, NP

## 2023-01-17 ENCOUNTER — Ambulatory Visit: Payer: BC Managed Care – PPO | Admitting: Sports Medicine

## 2023-01-17 ENCOUNTER — Encounter: Payer: Self-pay | Admitting: Sports Medicine

## 2023-01-17 ENCOUNTER — Other Ambulatory Visit (INDEPENDENT_AMBULATORY_CARE_PROVIDER_SITE_OTHER): Payer: BC Managed Care – PPO

## 2023-01-17 DIAGNOSIS — M1811 Unilateral primary osteoarthritis of first carpometacarpal joint, right hand: Secondary | ICD-10-CM

## 2023-01-17 DIAGNOSIS — M65312 Trigger thumb, left thumb: Secondary | ICD-10-CM

## 2023-01-17 DIAGNOSIS — M1812 Unilateral primary osteoarthritis of first carpometacarpal joint, left hand: Secondary | ICD-10-CM

## 2023-01-17 MED ORDER — METHYLPREDNISOLONE 4 MG PO TBPK: ORAL_TABLET | ORAL | 0 refills | Status: AC

## 2023-01-17 MED ORDER — LIDOCAINE HCL 1 % IJ SOLN
1.0000 mL | INTRAMUSCULAR | Status: AC | PRN
  Administered 2023-01-17: 1 mL

## 2023-01-17 MED ORDER — BETAMETHASONE SOD PHOS & ACET 6 (3-3) MG/ML IJ SUSP
6.0000 mg | INTRAMUSCULAR | Status: AC | PRN
  Administered 2023-01-17: 6 mg via INTRA_ARTICULAR

## 2023-01-17 NOTE — Progress Notes (Signed)
Patient says that he is beginning to have the same symptoms in the right hand as he was in the left hand.   Patient's primary complain today is the left thumb. He demonstrates where he tries to bend the thumb and when he is able to bend it it gets stuck in the flexed position; he says that sometimes he can straighten it, but others he has to use his other hand to do that. He does say that this is painful.

## 2023-01-17 NOTE — Progress Notes (Signed)
Logan Bruce - 57 y.o. male MRN [id number]  Date of birth: [birth year]  Office Visit Note: Visit Date: 01/17/2023 PCP: Eden Emms, NP Referred by: Eden Emms, NP  Subjective: Chief Complaint  Patient presents with   Left Hand - Pain    Thumb   Right Hand - Pain   HPI: Logan Bruce is a pleasant 57 y.o. male who presents today for bilateral thumb pain and L-thumb triggering.  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  We did proceed with ultrasound-guided CMC injection back in April which gave him excellent relief.  Has some mild relief at that spot but more bothersome is active triggering episodes of the thumb for the past 2 weeks.  Pain and swelling with that.  Reports both active and passive triggering.  He also is having right thumb pain at the base of the thumb similar to his contralateral left side but this is not as bad.  He is not taking consistent medication for this.  Pertinent ROS were reviewed with the patient and found to be negative unless otherwise specified above in HPI.   Assessment & Plan: Visit Diagnoses:  1. Trigger thumb, left thumb   2. Arthritis of carpometacarpal (CMC) joint of right thumb   3. Primary osteoarthritis of first carpometacarpal joint of left hand    Plan: Logan Bruce has advanced osteoarthritis of the left CMC joint which responded well for many months with wire CMC joint injection.  This is doing mostly better, but his right hand is starting to slowly bother him as well.  X-rays here show only mild arthritic change.  His most bothersome diagnosis is trigger thumb of the left thumb.  We discussed all options such as oral medication, splinting, injection therapy, surgical intervention.  Through shared decision-making, did proceed with trigger thumb injection into the A1 pulley.  Band-Aid splint was applied for the next 48 to 72 hours, may use ice and Tylenol as well.  For the inflammation and the pain in both thumbs we will  start him on a 6-day Medrol Dosepak and then he may discontinue.  I will see him back in 1 month for reevaluation.  If he has complete resolution of his triggering, we will leave this alone, if it is still having some triggering episodes we could consider 1 additional trigger thumb injection.  Follow-up: Return in about 1 month (around 02/16/2023) for for L-trigger thumb.   Meds & Orders:  Meds ordered this encounter  Medications   methylPREDNISolone (MEDROL DOSEPAK) 4 MG TBPK tablet    Sig: Take per packet instructions. Taper dosing.    Dispense:  1 each    Refill:  0    Orders Placed This Encounter  Procedures   Hand/UE Inj   XR Hand Complete Right     Procedures: Hand/UE Inj: L thumb A1 for trigger finger on 01/17/2023 4:51 PM Indications: pain and tendon swelling Details: 25 G needle, volar approach Medications: 1 mL lidocaine 1 %; 6 mg betamethasone acetate-betamethasone sodium phosphate 6 (3-3) MG/ML Outcome: tolerated well, no immediate complications Procedure, treatment alternatives, risks and benefits explained, specific risks discussed. Consent was given by the patient. Immediately prior to procedure a time out was called to verify the correct patient, procedure, equipment, support staff and site/side marked as required. Patient was prepped and draped in the usual sterile fashion.          Clinical History: No specialty comments available.  He reports  that he has never smoked. His smokeless tobacco use includes snuff.  Recent Labs    05/09/22 1542  HGBA1C 5.4    Objective:    Physical Exam  Gen: Well-appearing, in no acute distress; non-toxic CV: Well-perfused. Warm.  Resp: Breathing unlabored on room air; no wheezing. Psych: Fluid speech in conversation; appropriate affect; normal thought process Neuro: Sensation intact throughout. No gross coordination deficits.   Ortho Exam - Left thumb: There is bony bossing at the [facility] joint with some swelling over the  base of the joint.  There is exquisite tenderness over the A1 pulley with both active and passive triggering episodes reproducible on exam.  - Right thumb: Mild CMC grind test.  No redness swelling or effusion.  Imaging: XR Hand Complete Right  Result Date: 01/17/2023 Complete right hand x-ray including AP, lateral, oblique and Su Hilt view was ordered and reviewed by myself.  X-ray shows mild CMC and STT arthritic change, but certainly not advanced.  There is a small amount of periarticular spurring.  No acute fracture noted.   Past Medical/Family/Surgical/Social History: Medications & Allergies reviewed per EMR, new medications updated. Patient Active Problem List   Diagnosis Date Noted   Body aches 08/11/2022   Bronchitis 08/11/2022   Cold sore 08/11/2022   Shortness of breath 08/11/2022   Elevated liver enzymes 10/25/2021   Pain of left hand 10/25/2021   Family discord 08/18/2021   Essential hypertension 06/10/2019   Attention deficit hyperactivity disorder (ADHD) 06/10/2019   Other insomnia 06/10/2019   Gastroesophageal reflux disease without esophagitis 06/10/2019   Preventative health care 06/10/2019   Tendinitis of thumb 06/10/2019   Mixed hyperlipidemia 06/10/2019   History of prediabetes 06/10/2019   Past Medical History:  Diagnosis Date   Acid reflux    ADHD    Anxiety    Diverticulitis    Hypertension    Insomnia    Family History  Problem Relation Age of Onset   Coronary artery disease Mother        had arteries cleaned out   Hepatitis C Mother    Other Father        had pancreatic tumor, lung tumor   Congestive Heart Failure Maternal Grandmother    Heart attack Maternal Grandfather    Stomach cancer Maternal Grandfather    Past Surgical History:  Procedure Laterality Date   KNEE SURGERY Left    x3 reconstruction-[facility]   Social History   Occupational History   Not on file  Tobacco Use   Smoking status: Never   Smokeless  tobacco: Current    Types: Snuff  Vaping Use   Vaping status: Never Used  Substance and Sexual Activity   Alcohol use: Yes    Alcohol/week: 42.0 standard drinks of alcohol    Types: 42 Cans of beer per week    Comment: a week. Depends on the day 6-15   Drug use: No   Sexual activity: Not on file

## 2023-01-28 ENCOUNTER — Other Ambulatory Visit: Payer: Self-pay | Admitting: Nurse Practitioner

## 2023-01-28 DIAGNOSIS — K219 Gastro-esophageal reflux disease without esophagitis: Secondary | ICD-10-CM

## 2023-02-15 ENCOUNTER — Other Ambulatory Visit: Payer: Self-pay | Admitting: Nurse Practitioner

## 2023-02-15 DIAGNOSIS — F909 Attention-deficit hyperactivity disorder, unspecified type: Secondary | ICD-10-CM

## 2023-02-15 NOTE — Telephone Encounter (Signed)
amphetamine-dextroamphetamine (ADDERALL) 20 MG tablet Last seen 12/19/22 HTN  Follow up 06/20/2022 last given 01/10/23 with note that needs to be seen script was givne 11/15/22

## 2023-02-16 ENCOUNTER — Encounter: Payer: Self-pay | Admitting: Sports Medicine

## 2023-02-16 ENCOUNTER — Ambulatory Visit: Payer: BC Managed Care – PPO | Admitting: Sports Medicine

## 2023-02-16 DIAGNOSIS — M65312 Trigger thumb, left thumb: Secondary | ICD-10-CM | POA: Diagnosis not present

## 2023-02-16 DIAGNOSIS — M1811 Unilateral primary osteoarthritis of first carpometacarpal joint, right hand: Secondary | ICD-10-CM

## 2023-02-16 DIAGNOSIS — M1812 Unilateral primary osteoarthritis of first carpometacarpal joint, left hand: Secondary | ICD-10-CM

## 2023-02-16 DIAGNOSIS — M180 Bilateral primary osteoarthritis of first carpometacarpal joints: Secondary | ICD-10-CM

## 2023-02-16 NOTE — Progress Notes (Signed)
Patient says that the triggering in his left thumb has gone away. He says that this stopped about 1 week after his injection, and has not returned at all since. He says that he is now having throbbing in both hands due to his arthritis. He says that he uses his hands all day at work.

## 2023-02-16 NOTE — Progress Notes (Signed)
DIANA GERSHON - 57 y.o. male MRN [id number]  Date of birth: 03-Sep-1965  Office Visit Note: Visit Date: 02/16/2023 PCP: Eden Emms, NP Referred by: Eden Emms, NP  Subjective: Chief Complaint  Patient presents with   Left Hand - Pain, Follow-up    Thumb   Right Hand - Pain, Follow-up   HPI: BRODYN ROYCE is a pleasant 57 y.o. 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  Trigger thumb, left -back in 01/17/2023 we did proceed with trigger thumb injection into the A1 pulley.  He had some pain for about 1 week but then following this had complete relief of pain and has not had any episodes of triggering since that time. Feels back to normal.  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  This is getting worse on him, his left causes him discomfort with many gripping and repetitive activity.  Back in April of this year we did proceed with ultrasound-guided left [facility] joint injection which gave him good relief for 4 to 5 months.  He uses over-the-counter arthritis 2 tablets once daily but takes no other medication for this.  Pertinent ROS were reviewed with the patient and found to be negative unless otherwise specified above in HPI.   Assessment & Plan: Visit Diagnoses:  1. Arthritis of carpometacarpal (CMC) joint of left thumb   2. Arthritis of carpometacarpal (CMC) joint of right thumb   3. Trigger thumb, left thumb    Plan: Nida Boatman has had complete resolution of his left trigger thumb after a one-time injection into the A1 pulley 1 month ago.  He has not had any recurrence of triggering episodes and has no pain.  However, he does have advanced left thumb CMC osteoarthritis and mild to moderate on the contralateral right thumb.  He does a lot of work with his hands and this is limiting his ability to do so.  At this point, I think it is best he sees our hand surgeon, Dr. Fara Boros, to discuss  possible surgical intervention or other treatment modalities.  If surgery is not recommended or he does not wish to proceed with this, he could see me back for ultrasound-guided [facility] joint injections but I would like to have Dr. Jamie Kato opinion first.  Nida Boatman is agreeable and understanding to this plan.  Additional treatment considerations could include cool comfort CMC brace. Ok for continuing his OTC arthritis medication daily PRN.  Follow-up: Return for make appt with Dr. Fara Boros for [facility] arthritis L > R thumb.   Meds & Orders: No orders of the defined types were placed in this encounter.  No orders of the defined types were placed in this encounter.    Procedures: No procedures performed      Clinical History: No specialty comments available.  He reports that he has never smoked. His smokeless tobacco use includes snuff.  Recent Labs    05/09/22 1542  HGBA1C 5.4    Objective:    Physical Exam  Gen: Well-appearing, in no acute distress; non-toxic CV: Well-perfused. Warm.  Resp: Breathing unlabored on room air; no wheezing. Psych: Fluid speech in conversation; appropriate affect; normal thought process Neuro: Sensation intact throughout. No gross coordination deficits.   Ortho Exam - Bilateral thumbs: Left thumb without any tenderness over the A1 pulley, no triggering with active and passive range of motion.  There is significant bony bossing  of the left CMC joint, positive CMC grind test and limited mobility.  The right side has mild bossing with equivocal CMC grind.   Imaging:  XR Hand Complete Right Complete right hand x-ray including AP, lateral, oblique and Su Hilt view  was ordered and reviewed by myself.  X-ray shows mild CMC and STT  arthritic change, but certainly not advanced.  There is a small amount of  periarticular spurring.  No acute fracture noted.  Narrative & Impression  CLINICAL DATA:  Pain and swelling today.  No known injury.   EXAM: LEFT HAND - COMPLETE 3+  VIEW   COMPARISON:  Hand radiograph 10/25/2021   FINDINGS: There is no evidence of fracture or dislocation. Moderate to advanced arthropathy of the thumb at the carpal metacarpal joint with joint space narrowing, subchondral cystic change and fragmented osteophytes. Minimal degenerative spurring of the thumb at the metacarpal phalangeal joint. No erosions or periostitis. No soft tissue gas or radiopaque foreign body. Possible soft tissue edema the thenar eminence.   IMPRESSION: 1. Moderate to advanced arthropathy of the thumb at the carpometacarpal joint, likely osteoarthritis. This is unchanged from August exam. 2. Possible soft tissue edema of the thenar eminence.     Electronically Signed   By: Narda Rutherford M.D.   On: 01/11/2022 16:44    Past Medical/Family/Surgical/Social History: Medications & Allergies reviewed per EMR, new medications updated. Patient Active Problem List   Diagnosis Date Noted   Body aches 08/11/2022   Bronchitis 08/11/2022   Cold sore 08/11/2022   Shortness of breath 08/11/2022   Elevated liver enzymes 10/25/2021   Pain of left hand 10/25/2021   Family discord 08/18/2021   Essential hypertension 06/10/2019   Attention deficit hyperactivity disorder (ADHD) 06/10/2019   Other insomnia 06/10/2019   Gastroesophageal reflux disease without esophagitis 06/10/2019   Preventative health care 06/10/2019   Tendinitis of thumb 06/10/2019   Mixed hyperlipidemia 06/10/2019   History of prediabetes 06/10/2019   Past Medical History:  Diagnosis Date   Acid reflux    ADHD    Anxiety    Diverticulitis    Hypertension    Insomnia    Family History  Problem Relation Age of Onset   Coronary artery disease Mother        had arteries cleaned out   Hepatitis C Mother    Other Father        had pancreatic tumor, lung tumor   Congestive Heart Failure Maternal Grandmother    Heart attack Maternal Grandfather    Stomach cancer Maternal Grandfather     Past Surgical History:  Procedure Laterality Date   KNEE SURGERY Left    x3 reconstruction-[facility]   Social History   Occupational History   Not on file  Tobacco Use   Smoking status: Never   Smokeless tobacco: Current    Types: Snuff  Vaping Use   Vaping status: Never Used  Substance and Sexual Activity   Alcohol use: Yes    Alcohol/week: 42.0 standard drinks of alcohol    Types: 42 Cans of beer per week    Comment: a week. Depends on the day 6-15   Drug use: No   Sexual activity: Not on file   I spent 32 minutes in the care of the patient today including face-to-face time, preparation to see the patient, as well as review of previous imaging of each thumb/hand x-ray personally in the room with the patient, discussion on general overview of possible surgery  and CMC arthroplasty that could be performed by one of my orthopedic colleagues, option of cool comfort bracing and other conservative treatment options for the above diagnoses.   Madelyn Brunner, DO Primary Care Sports Medicine Physician  [facility] - Orthopedics  This note was dictated using Dragon naturally speaking software and may contain errors in syntax, spelling, or content which have not been identified prior to signing this note.

## 2023-02-20 NOTE — Telephone Encounter (Signed)
Patient called to f/u on med refill request. Was wondering if this could be filled? States he is out of medication

## 2023-02-21 ENCOUNTER — Other Ambulatory Visit (INDEPENDENT_AMBULATORY_CARE_PROVIDER_SITE_OTHER): Payer: BC Managed Care – PPO

## 2023-02-21 ENCOUNTER — Ambulatory Visit: Payer: BC Managed Care – PPO | Admitting: Orthopedic Surgery

## 2023-02-21 DIAGNOSIS — M1812 Unilateral primary osteoarthritis of first carpometacarpal joint, left hand: Secondary | ICD-10-CM

## 2023-02-21 NOTE — Telephone Encounter (Signed)
Patient called in to follow up on this refill. He would like a call regarding this can't be filled.

## 2023-02-21 NOTE — Telephone Encounter (Signed)
Pt called checking status of med refill. Pt states he has been requesting med refill since last week. Call back # [phone]

## 2023-02-21 NOTE — Progress Notes (Signed)
Logan Bruce - 57 y.o. male MRN [id number]  Date of birth: Dec 19, 1965  Office Visit Note: Visit Date: 02/21/2023 PCP: Eden Emms, NP Referred by: Eden Emms, NP  Subjective: No chief complaint on file.  HPI: Logan Bruce is a pleasant 57 y.o. male who presents today for evaluation of ongoing bilateral thumb basilar joint pain, left greater than right.  Symptoms have been present now for greater than 6 months, worsening in nature.  Has trialed bracing, activity modification and is undergone prior injection to the left thumb CMC interval in April of this year without lasting relief.  Also underwent injection for the left thumb trigger digit in November of this year with resolution of the triggering.  Denies any numbness or tingling.  Pertinent ROS were reviewed with the patient and found to be negative unless otherwise specified above in HPI.   Visit Reason: bilateral basilar thumb pain L>R Duration of symptoms: 6+ months Hand dominance: right Occupation:Retired Diabetic: No Smoking: No Heart/Lung History: none Blood Thinners: none  Prior Testing/EMG: xrays 2023 Injections (Date): Left thumb [facility] April 2024- did help some Treatments:injection, brace Prior Surgery: none  Assessment & Plan: Visit Diagnoses:  1. Arthritis of carpometacarpal (CMC) joint of left thumb     Plan: Extensive discussion was had with the patient today regarding his ongoing bilateral thumb CMC osteoarthritis.  X-rays were reviewed in detail today which do show significant degenerative change at the left thumb [facility] articulation in particular.  There is degenerative change seen on the recent right hand x-rays as well at the right thumb [facility] articulation.  We discussed treatment modalities ranging from conservative to surgical.  From a conservative standpoint we discussed ongoing bracing, injections, anti-inflammatory medications and activity modification.  From a surgical standpoint we discussed [facility]  arthroplasty, risks and benefits as well as the postoperative protocol.  At this juncture, given the severity of symptoms that have remained refractory to conservative care, patient would like to move forward with surgical intervention at the left thumb CMC.  Given the significance of the degenerative change on x-ray which clinically correlates with examination, we can move forward with surgical scheduling of left thumb CMC arthroplasty at the next available date per patient preference.  Risks and benefits of the procedure were discussed, risks including but not limited to infection, bleeding, scarring, stiffness, nerve injury, tendon injury, vascular injury, hardware complication, recurrence of symptoms and need for subsequent operation.  We also discussed the specifics of the postoperative protocol and the appropriate timeline.  Patient expressed understanding.   Follow-up: No follow-ups on file.   Meds & Orders: No orders of the defined types were placed in this encounter.   Orders Placed This Encounter  Procedures   XR Wrist Complete Left     Procedures: No procedures performed      Clinical History: No specialty comments available.  He reports that he has never smoked. His smokeless tobacco use includes snuff.  Recent Labs    05/09/22 1542  HGBA1C 5.4    Objective:   Vital Signs: There were no vitals taken for this visit.  Physical Exam  Gen: Well-appearing, in no acute distress; non-toxic CV: Regular Rate. Well-perfused. Warm.  Resp: Breathing unlabored on room air; no wheezing. Psych: Fluid speech in conversation; appropriate affect; normal thought process  Ortho Exam General: Patient is well appearing and in no distress. Cervical spine mobility is full in all directions:   Skin and Muscle: No skin changes are  apparent to upper extremities.  Muscle bulk and contour normal, no signs of atrophy.      Range of Motion and Palpation Tests: Mobility is full about the  elbows with flexion and extension.  Forearm supination and pronation are 85/85 bilaterally.  Wrist flexion/extension is 75/65 bilaterally.  Digital flexion and extension are full.  Thumb opposition is full to the base of the small fingers bilaterally.     No cords or nodules are palpated.  No triggering is observed.     Significant tenderness over the left thumb CMC articulation is observed, positive grind for pain, positive crepitus.  MP hyperextension negative.    Finklestein test positive left  Moderate tenderness at the right thumb CMC articulation, positive grind for pain, mild crepitus.  MP hyperextension negative.  Finkelstein's negative right side.   Neurologic, Vascular, Motor: Sensation is intact to light touch in the median/radial/ulnar distributions.  Tinel's testing negative at wrist level. Phalen's negative bilaterally, Derkan's compression negative bilaterally.  Fingers pink and well perfused.  Capillary refill is brisk.     Imaging: X-rays of the left wrist, multiple views were obtained today including pantrapezial view X-rays demonstrate significant degenerative change at the thumb [facility] interval with joint space narrowing, osteophyte formation and subchondral sclerosis.  There is associated STT arthritis as well  Past Medical/Family/Surgical/Social History: Medications & Allergies reviewed per EMR, new medications updated. Patient Active Problem List   Diagnosis Date Noted   Body aches 08/11/2022   Bronchitis 08/11/2022   Cold sore 08/11/2022   Shortness of breath 08/11/2022   Elevated liver enzymes 10/25/2021   Pain of left hand 10/25/2021   Family discord 08/18/2021   Essential hypertension 06/10/2019   Attention deficit hyperactivity disorder (ADHD) 06/10/2019   Other insomnia 06/10/2019   Gastroesophageal reflux disease without esophagitis 06/10/2019   Preventative health care 06/10/2019   Tendinitis of thumb 06/10/2019   Mixed hyperlipidemia 06/10/2019    History of prediabetes 06/10/2019   Past Medical History:  Diagnosis Date   Acid reflux    ADHD    Anxiety    Diverticulitis    Hypertension    Insomnia    Family History  Problem Relation Age of Onset   Coronary artery disease Mother        had arteries cleaned out   Hepatitis C Mother    Other Father        had pancreatic tumor, lung tumor   Congestive Heart Failure Maternal Grandmother    Heart attack Maternal Grandfather    Stomach cancer Maternal Grandfather    Past Surgical History:  Procedure Laterality Date   KNEE SURGERY Left    x3 reconstruction-[facility]   Social History   Occupational History   Not on file  Tobacco Use   Smoking status: Never   Smokeless tobacco: Current    Types: Snuff  Vaping Use   Vaping status: Never Used  Substance and Sexual Activity   Alcohol use: Yes    Alcohol/week: 42.0 standard drinks of alcohol    Types: 42 Cans of beer per week    Comment: a week. Depends on the day 6-15   Drug use: No   Sexual activity: Not on file    Ellese Julius Fara Boros) Denese Killings, M.D. [facility]

## 2023-02-22 NOTE — Telephone Encounter (Signed)
Pt has no refills for: Adderall 20mg . need to make OV for further refills?

## 2023-02-22 NOTE — Telephone Encounter (Signed)
Did we call the pharmacy. He should have a script on file

## 2023-02-22 NOTE — Telephone Encounter (Signed)
Contacted pharmacy. Pharmacy staff stated that they do have one on file and will start filling the prescription now.

## 2023-03-21 ENCOUNTER — Other Ambulatory Visit: Payer: Self-pay | Admitting: Nurse Practitioner

## 2023-03-21 DIAGNOSIS — G4709 Other insomnia: Secondary | ICD-10-CM

## 2023-03-21 DIAGNOSIS — F909 Attention-deficit hyperactivity disorder, unspecified type: Secondary | ICD-10-CM

## 2023-03-23 MED ORDER — AMPHETAMINE-DEXTROAMPHETAMINE 20 MG PO TABS: 20.0000 mg | ORAL_TABLET | Freq: Every day | ORAL | 0 refills | Status: DC

## 2023-03-23 MED ORDER — AMPHETAMINE-DEXTROAMPHETAMINE 20 MG PO TABS: 20.0000 mg | ORAL_TABLET | Freq: Every morning | ORAL | 0 refills | Status: DC

## 2023-03-23 MED ORDER — AMPHETAMINE-DEXTROAMPHETAMINE 20 MG PO TABS
20.0000 mg | ORAL_TABLET | Freq: Every day | ORAL | 0 refills | Status: DC
Start: 2023-03-23 — End: 2023-06-20

## 2023-05-22 ENCOUNTER — Other Ambulatory Visit: Payer: Self-pay | Admitting: Nurse Practitioner

## 2023-05-22 DIAGNOSIS — F909 Attention-deficit hyperactivity disorder, unspecified type: Secondary | ICD-10-CM

## 2023-05-23 ENCOUNTER — Other Ambulatory Visit: Payer: Self-pay | Admitting: Nurse Practitioner

## 2023-05-23 DIAGNOSIS — F909 Attention-deficit hyperactivity disorder, unspecified type: Secondary | ICD-10-CM

## 2023-05-23 NOTE — Telephone Encounter (Signed)
 Left message on VM for pt that the pharmacy should have a predated rx from January with a fill date of 05-17-33.

## 2023-05-23 NOTE — Telephone Encounter (Signed)
 Copied from CRM [id number]. Topic: Clinical - Medication Refill >> May 23, 2023  8:56 AM Drema Balzarine wrote: Most Recent Primary Care Visit:  Provider: Eden Emms  Department: [facility]  Visit Type: OFFICE VISIT  Date: 12/19/2022  Medication: amphetamine-dextroamphetamine (ADDERALL) 20 MG tablet   Has the patient contacted their pharmacy? Yes, he's out of refills  (Agent: If no, request that the patient contact the pharmacy for the refill. If patient does not wish to contact the pharmacy document the reason why and proceed with request.) (Agent: If yes, when and what did the pharmacy advise?)  Is this the correct pharmacy for this prescription? Yes If no, delete pharmacy and type the correct one.  This is the patient's preferred pharmacy:   [phone] [place] - [name] [place] [address] Phone: [phone] Fax: [phone]   Has the prescription been filled recently? Yes  Is the patient out of the medication? Patient runs of of medications tomorrow 05/24/23   Has the patient been seen for an appointment in the last year OR does the patient have an upcoming appointment? Yes  Can we respond through MyChart? Yes  Agent: Please be advised that Rx refills may take up to 3 business days. We ask that you follow-up with your pharmacy.

## 2023-05-25 ENCOUNTER — Other Ambulatory Visit: Payer: Self-pay | Admitting: Nurse Practitioner

## 2023-05-25 DIAGNOSIS — I10 Essential (primary) hypertension: Secondary | ICD-10-CM

## 2023-06-02 ENCOUNTER — Other Ambulatory Visit: Payer: Self-pay | Admitting: Nurse Practitioner

## 2023-06-02 DIAGNOSIS — F909 Attention-deficit hyperactivity disorder, unspecified type: Secondary | ICD-10-CM

## 2023-06-02 NOTE — Telephone Encounter (Signed)
 Copied from CRM [id number]. Topic: Clinical - Prescription Issue >> Jun 02, 2023  2:50 PM Denese Killings wrote: Reason for CRM: Patient is requesting a callback from the nurse or Mordecai Maes. He states that he needs a new prescription for amphetamine-dextroamphetamine (ADDERALL) 20 MG tablet. [facility] only [facility] 10 day supply available. Patient said that he used his 30 day prescription on the 10 day supply. [facility] couldn't [facility] or send to another Pharm[facility]. He is going to run out of pills on Sunday.

## 2023-06-05 MED ORDER — AMPHETAMINE-DEXTROAMPHETAMINE 20 MG PO TABS
20.0000 mg | ORAL_TABLET | Freq: Every day | ORAL | 0 refills | Status: DC
Start: 2023-06-05 — End: 2023-06-20

## 2023-06-05 NOTE — Telephone Encounter (Signed)
 Patient is needing his medication filled today he has been without it he would like a call back regarding this

## 2023-06-20 ENCOUNTER — Ambulatory Visit (INDEPENDENT_AMBULATORY_CARE_PROVIDER_SITE_OTHER): Payer: Self-pay | Admitting: Nurse Practitioner

## 2023-06-20 ENCOUNTER — Encounter: Payer: Self-pay | Admitting: Nurse Practitioner

## 2023-06-20 VITALS — BP 130/88 | HR 94 | Temp 97.9°F | Ht 65.85 in | Wt 175.0 lb

## 2023-06-20 DIAGNOSIS — Z114 Encounter for screening for human immunodeficiency virus [HIV]: Secondary | ICD-10-CM

## 2023-06-20 DIAGNOSIS — Z125 Encounter for screening for malignant neoplasm of prostate: Secondary | ICD-10-CM

## 2023-06-20 DIAGNOSIS — Z1211 Encounter for screening for malignant neoplasm of colon: Secondary | ICD-10-CM

## 2023-06-20 DIAGNOSIS — E782 Mixed hyperlipidemia: Secondary | ICD-10-CM

## 2023-06-20 DIAGNOSIS — Z23 Encounter for immunization: Secondary | ICD-10-CM

## 2023-06-20 DIAGNOSIS — Z87898 Personal history of other specified conditions: Secondary | ICD-10-CM

## 2023-06-20 DIAGNOSIS — I10 Essential (primary) hypertension: Secondary | ICD-10-CM

## 2023-06-20 DIAGNOSIS — Z0000 Encounter for general adult medical examination without abnormal findings: Secondary | ICD-10-CM

## 2023-06-20 DIAGNOSIS — Z1159 Encounter for screening for other viral diseases: Secondary | ICD-10-CM | POA: Diagnosis not present

## 2023-06-20 DIAGNOSIS — K219 Gastro-esophageal reflux disease without esophagitis: Secondary | ICD-10-CM

## 2023-06-20 DIAGNOSIS — F909 Attention-deficit hyperactivity disorder, unspecified type: Secondary | ICD-10-CM

## 2023-06-20 LAB — LIPID PANEL
Cholesterol: 230 mg/dL — ABNORMAL HIGH (ref 0–200)
HDL: 62.1 mg/dL (ref >39.00)
LDL Cholesterol: 128 mg/dL — ABNORMAL HIGH (ref 0–99)
NonHDL: 167.51
Total CHOL/HDL Ratio: 4
Triglycerides: 198 mg/dL — ABNORMAL HIGH (ref 0.0–149.0)
VLDL: 39.6 mg/dL (ref 0.0–40.0)

## 2023-06-20 LAB — COMPREHENSIVE METABOLIC PANEL WITH GFR
ALT: 38 U/L (ref 0–53)
AST: 44 U/L — ABNORMAL HIGH (ref 0–37)
Albumin: 4.6 g/dL (ref 3.5–5.2)
Alkaline Phosphatase: 80 U/L (ref 39–117)
BUN: 12 mg/dL (ref 6–23)
CO2: 26 meq/L (ref 19–32)
Calcium: 9.6 mg/dL (ref 8.4–10.5)
Chloride: 97 meq/L (ref 96–112)
Creatinine, Ser: 1.06 mg/dL (ref 0.40–1.50)
GFR: 78.79 mL/min (ref >60.00)
Glucose, Bld: 105 mg/dL — ABNORMAL HIGH (ref 70–99)
Potassium: 4.8 meq/L (ref 3.5–5.1)
Sodium: 133 meq/L — ABNORMAL LOW (ref 135–145)
Total Bilirubin: 0.7 mg/dL (ref 0.2–1.2)
Total Protein: 7.2 g/dL (ref 6.0–8.3)

## 2023-06-20 LAB — PSA: PSA: 1.35 ng/mL (ref 0.10–4.00)

## 2023-06-20 LAB — TSH: TSH: 1.74 u[IU]/mL (ref 0.35–5.50)

## 2023-06-20 LAB — CBC
HCT: 44.2 % (ref 39.0–52.0)
Hemoglobin: 15.2 g/dL (ref 13.0–17.0)
MCHC: 34.3 g/dL (ref 30.0–36.0)
MCV: 97.7 fl (ref 78.0–100.0)
Platelets: 294 10*3/uL (ref 150.0–400.0)
RBC: 4.53 Mil/uL (ref 4.22–5.81)
RDW: 12.6 % (ref 11.5–15.5)
WBC: 4.6 10*3/uL (ref 4.0–10.5)

## 2023-06-20 LAB — HEMOGLOBIN A1C: Hgb A1c MFr Bld: 5.7 % (ref 4.6–6.5)

## 2023-06-20 MED ORDER — AMLODIPINE BESYLATE 10 MG PO TABS: 10.0000 mg | ORAL_TABLET | Freq: Every day | ORAL | 3 refills | Status: AC

## 2023-06-20 MED ORDER — IRBESARTAN 300 MG PO TABS: 300.0000 mg | ORAL_TABLET | Freq: Every day | ORAL | 3 refills | Status: AC

## 2023-06-20 MED ORDER — AMPHETAMINE-DEXTROAMPHET ER 20 MG PO CP24: 20.0000 mg | ORAL_CAPSULE | ORAL | 0 refills | Status: DC

## 2023-06-20 MED ORDER — AMPHETAMINE-DEXTROAMPHET ER 20 MG PO CP24
20.0000 mg | ORAL_CAPSULE | ORAL | 0 refills | Status: DC
Start: 2023-06-20 — End: 2023-10-02

## 2023-06-20 MED ORDER — OMEPRAZOLE 20 MG PO CPDR: 20.0000 mg | DELAYED_RELEASE_CAPSULE | Freq: Every day | ORAL | 3 refills | Status: AC

## 2023-06-20 NOTE — Patient Instructions (Signed)
Nice to see you today I will be in touch with the labs once I have them Follow up with me in 6 months, sooner if you need me 

## 2023-06-20 NOTE — Assessment & Plan Note (Signed)
 Patient currently maintained on omeprazole 20 mg daily.  Stable.  Continue medication as prescribed

## 2023-06-20 NOTE — Assessment & Plan Note (Signed)
 Patient was maintained on Adderall 20 mg IR daily.  He would like to switch back to the extended release version.  Will oblige.  Adderall 20 mg XR 1 tablet daily to pharmacy.  PDMP reviewed

## 2023-06-20 NOTE — Assessment & Plan Note (Signed)
 History of the same.  Pending lipid panel today

## 2023-06-20 NOTE — Progress Notes (Signed)
 Established Patient Office Visit  Subjective   Patient ID: Logan Bruce, male    DOB: [birth year]  Age: 58 y.o. MRN: [id number]  Chief Complaint  Patient presents with   Annual Exam   Medication Refill    Amlodipine, irbesartan, and omeprazole    HPI  for complete physical and follow up of chronic conditions.   ADHD: patient is currently on adderall 20 mg QD. Tolerates it well. He would like to go back to the 20mg  XR. States they switched him because he was having trouble sleeping   Insomnia: Patient currently maintained on triazolam 0.25 mg 2 tabs nightly as needed. States that sometimes he is able to get by with just one tablet of the triazolam   HTN: Patient currently maintained on amlodipine 10 mg, irbesartan 300 mg, hydrochlorothiazide 12.5 mg. Does not currently check it at home.   GERD: Patient currently maintained on omeprazole 20 mg daily.  Immunizations: -Tetanus: Completed in 2015, needs updating  -Influenza: 01/31/2023 -Shingles: Completed Shingrix series -Pneumonia: Too young  Diet: Fair diet.  Patient eating 2-3 meals a day with snacks. Gatorade and water.  Exercise: No regular exercise.  Physical employment.  Eye exam: Completes annually.  Wears glasses Dental exam: Completes semi-annually  Dr. Mayford Knife   Colonoscopy: referral to [facility] Lung Cancer Screening: N/A  PSA: Due  Sleep: goes to bed around 12-1028 and gets up around 630. Does feel rested       Review of Systems  Constitutional:  Negative for chills and fever.  Respiratory:  Negative for shortness of breath.   Cardiovascular:  Negative for chest pain and leg swelling.  Gastrointestinal:  Positive for diarrhea. Negative for abdominal pain, blood in stool, constipation, nausea and vomiting.       BM daily   Genitourinary:  Negative for dysuria and hematuria.  Neurological:  Negative for tingling and headaches.  Psychiatric/Behavioral:  Negative for hallucinations and suicidal ideas.        Objective:     BP 130/88   Pulse 94   Temp 97.9 F (36.6 C) (Oral)   Ht 5' 5.85" (1.673 m)   Wt 175 lb (79.4 kg)   SpO2 94%   BMI 28.37 kg/m  BP Readings from Last 3 Encounters:  06/20/23 130/88  12/19/22 (!) 138/98  08/11/22 136/78   Wt Readings from Last 3 Encounters:  06/20/23 175 lb (79.4 kg)  12/19/22 173 lb (78.5 kg)  08/11/22 164 lb (74.4 kg)   SpO2 Readings from Last 3 Encounters:  06/20/23 94%  12/19/22 93%  08/11/22 100%      Physical Exam Vitals and nursing note reviewed.  Constitutional:      Appearance: Normal appearance.  HENT:     Right Ear: Tympanic membrane, ear canal and external ear normal.     Left Ear: Tympanic membrane, ear canal and external ear normal.     Mouth/Throat:     Mouth: Mucous membranes are moist.     Pharynx: Oropharynx is clear.  Eyes:     Extraocular Movements: Extraocular movements intact.     Pupils: Pupils are equal, round, and reactive to light.  Cardiovascular:     Rate and Rhythm: Normal rate and regular rhythm.     Pulses: Normal pulses.     Heart sounds: Normal heart sounds.  Pulmonary:     Effort: Pulmonary effort is normal.     Breath sounds: Normal breath sounds.  Abdominal:     General: Bowel sounds are  normal. There is no distension.     Palpations: There is no mass.     Tenderness: There is no abdominal tenderness.     Hernia: No hernia is present.  Genitourinary:    Comments: deferred Musculoskeletal:     Right lower leg: No edema.     Left lower leg: No edema.  Lymphadenopathy:     Cervical: No cervical adenopathy.  Skin:    General: Skin is warm.  Neurological:     General: No focal deficit present.     Mental Status: He is alert.     Deep Tendon Reflexes:     Reflex Scores:      Bicep reflexes are 2+ on the right side and 2+ on the left side.      Patellar reflexes are 2+ on the right side and 2+ on the left side.    Comments: Bilateral upper and lower extremity strength 5/5   Psychiatric:        Mood and Affect: Mood normal.        Behavior: Behavior normal.        Thought Content: Thought content normal.        Judgment: Judgment normal.      No results found for any visits on 06/20/23.    The 10-year ASCVD risk score (Arnett DK, et al., 2019) is: 4.6%    Assessment & Plan:   Problem List Items Addressed This Visit       Cardiovascular and Mediastinum   Essential hypertension   Patient currently maintained on irbesartan 300 mg, amlodipine 10 mg, hydrochlorothiazide 12.5 mg daily.  Blood pressure controlled continue medications prescribed      Relevant Medications   amLODipine (NORVASC) 10 MG tablet   irbesartan (AVAPRO) 300 MG tablet   Other Relevant Orders   CBC   Comprehensive metabolic panel with GFR   TSH     Digestive   Gastroesophageal reflux disease without esophagitis   Patient currently maintained on omeprazole 20 mg daily.  Stable.  Continue medication as prescribed      Relevant Medications   omeprazole (PRILOSEC) 20 MG capsule     Endocrine   History of prediabetes   Pending A1c today.  Continue working on lifestyle modification      Relevant Orders   Hemoglobin A1c     Other   Attention deficit hyperactivity disorder (ADHD)   Patient was maintained on Adderall 20 mg IR daily.  He would like to switch back to the extended release version.  Will oblige.  Adderall 20 mg XR 1 tablet daily to pharmacy.  PDMP reviewed      Relevant Medications   amphetamine-dextroamphetamine (ADDERALL XR) 20 MG 24 hr capsule   amphetamine-dextroamphetamine (ADDERALL XR) 20 MG 24 hr capsule   amphetamine-dextroamphetamine (ADDERALL XR) 20 MG 24 hr capsule   Preventative health care - Primary   Discussed age-appropriate immunizations and screening exams.  Did review patient's personal, surgical, social, family histories.  Patient is up-to-date on all age-appropriate vaccinations he would like.  Update tetanus vaccine today.  Ambulatory  referral to GI for CRC screening today.  PSA today for prostate cancer screening.  Patient was given information at discharge about preventative healthcare maintenance with anticipatory guidance.      Mixed hyperlipidemia   History of the same.  Pending lipid panel today      Relevant Medications   amLODipine (NORVASC) 10 MG tablet   irbesartan (AVAPRO) 300 MG tablet   Other  Relevant Orders   Lipid panel   Other Visit Diagnoses       Encounter for hepatitis C screening test for low risk patient       Relevant Orders   Hepatitis C Antibody     Encounter for screening for HIV       Relevant Orders   HIV antibody (with reflex)     Screening for prostate cancer       Relevant Orders   PSA     Screening for colon cancer       Relevant Orders   Ambulatory referral to Gastroenterology     Need for Tdap vaccination       Relevant Orders   Tdap vaccine greater than or equal to 7yo IM (Completed)       Return in about 6 months (around 12/20/2023) for ADD/ADHD med recheck .    Audria Nine, NP

## 2023-06-20 NOTE — Assessment & Plan Note (Signed)
 Patient currently maintained on irbesartan 300 mg, amlodipine 10 mg, hydrochlorothiazide 12.5 mg daily.  Blood pressure controlled continue medications prescribed

## 2023-06-20 NOTE — Assessment & Plan Note (Signed)
 Pending A1c today.  Continue working on lifestyle modification

## 2023-06-20 NOTE — Assessment & Plan Note (Signed)
 Discussed age-appropriate immunizations and screening exams.  Did review patient's personal, surgical, social, family histories.  Patient is up-to-date on all age-appropriate vaccinations he would like.  Update tetanus vaccine today.  Ambulatory referral to GI for CRC screening today.  PSA today for prostate cancer screening.  Patient was given information at discharge about preventative healthcare maintenance with anticipatory guidance.

## 2023-06-21 ENCOUNTER — Telehealth: Payer: Self-pay

## 2023-06-21 ENCOUNTER — Other Ambulatory Visit (HOSPITAL_COMMUNITY): Payer: Self-pay

## 2023-06-21 ENCOUNTER — Encounter: Payer: Self-pay | Admitting: Nurse Practitioner

## 2023-06-21 ENCOUNTER — Other Ambulatory Visit: Payer: Self-pay | Admitting: Nurse Practitioner

## 2023-06-21 DIAGNOSIS — R7303 Prediabetes: Secondary | ICD-10-CM

## 2023-06-21 LAB — HEPATITIS C ANTIBODY: Hepatitis C Ab: NONREACTIVE

## 2023-06-21 LAB — HIV ANTIBODY (ROUTINE TESTING W REFLEX): HIV 1&2 Ab, 4th Generation: NONREACTIVE

## 2023-06-21 NOTE — Telephone Encounter (Signed)
 Pharmacy Patient Advocate Encounter  Received notification from [facility] that Prior Authorization for  Amphetamine-Dextroamphet ER 20MG  er capsules has been APPROVED from 06/21/2023 to 06/21/2026. Ran test claim, Copay is $16.00. This test claim was processed through [facility]- copay amounts may vary at other pharmacies due to pharmacy/plan contracts, or as the patient moves through the different stages of their insurance plan.   PA #/Case ID/Reference #: [id number]

## 2023-06-21 NOTE — Telephone Encounter (Signed)
 Clinical questions have been answered and PA submitted. PA currently Pending.

## 2023-06-21 NOTE — Telephone Encounter (Signed)
 Pharmacy Patient Advocate Encounter   Received notification from CoverMyMeds that prior authorization for Amphetamine-Dextroamphet ER 20MG  er capsules  is required/requested.   Insurance verification completed.   The patient is insured through [facility] .   Per test claim: PA required; PA started via CoverMyMeds. KEY [id number] . Waiting for clinical questions to populate.

## 2023-07-12 ENCOUNTER — Other Ambulatory Visit: Payer: Self-pay | Admitting: Nurse Practitioner

## 2023-07-12 DIAGNOSIS — G4709 Other insomnia: Secondary | ICD-10-CM

## 2023-07-12 NOTE — Telephone Encounter (Signed)
 Copied from CRM [id number]. Topic: Clinical - Medication Refill >> Jul 12, 2023 12:31 PM Rosamond Comes wrote: Patient calling in requesting medication refill  Most Recent Primary Care Visit:  Provider: Dorothe Gaster  Department: [facility]  Visit Type: PHYSICAL  Date: 06/20/2023  Medication: triazolam  (HALCION ) 0.25 MG tablet   Has the patient contacted their pharmacy? Yes was told to call pharmacy  (Agent: If no, request that the patient contact the pharmacy for the refill. If patient does not wish to contact the pharmacy document the reason why and proceed with request.) (Agent: If yes, when and what did the pharmacy advise?)  Is this the correct pharmacy for this prescription? Yes If no, delete pharmacy and type the correct one.  This is the patient's preferred pharmacy:  [phone] [place] - [address] [place] [address] Phone: [phone] Fax: [phone]   Has the prescription been filled recently? Yes  Is the patient out of the medication? Yes  Has the patient been seen for an appointment in the last year OR does the patient have an upcoming appointment? Yes  Can we respond through MyChart? Yes  Agent: Please be advised that Rx refills may take up to 3 business days. We ask that you follow-up with your pharmacy.

## 2023-07-24 ENCOUNTER — Other Ambulatory Visit: Payer: Self-pay | Admitting: Nurse Practitioner

## 2023-07-24 DIAGNOSIS — F909 Attention-deficit hyperactivity disorder, unspecified type: Secondary | ICD-10-CM

## 2023-07-28 NOTE — Telephone Encounter (Signed)
 Name of Medication: Adderall XR 20mg  Name of [facility]acy[id number] [place] - [address]  Last Fill or Written Date and Quantity: 06/20/2023 Last Office Visit and Type: 06/20/2023 for [facility] Next Office Visit and Type: none Last Controlled Substance Agreement Date: none Last UDS: none

## 2023-08-28 ENCOUNTER — Other Ambulatory Visit: Payer: Self-pay | Admitting: Nurse Practitioner

## 2023-08-28 NOTE — Telephone Encounter (Unsigned)
 Copied from CRM [id number]. Topic: Clinical - Medication Refill >> Aug 28, 2023 11:10 AM Shereese L wrote: Medication: triazolam  (HALCION ) 0.25 MG tablet  Has the patient contacted their pharmacy? Yes (Agent: If no, request that the patient contact the pharmacy for the refill. If patient does not wish to contact the pharmacy document the reason why and proceed with request.) (Agent: If yes, when and what did the pharmacy advise?)  This is the patient's preferred pharmacy:  [phone] [facility] [place] - [address] [place] [address] Phone: [phone] Fax: [phone]  Is this the correct pharmacy for this prescription? Yes If no, delete pharmacy and type the correct one.   Has the prescription been filled recently? Yes  Is the patient out of the medication? Yes  Has the patient been seen for an appointment in the last year OR does the patient have an upcoming appointment? Yes  Can we respond through MyChart? No  Agent: Please be advised that Rx refills may take up to 3 business days. We ask that you follow-up with your pharmacy.

## 2023-08-30 ENCOUNTER — Other Ambulatory Visit: Payer: Self-pay | Admitting: Nurse Practitioner

## 2023-08-30 DIAGNOSIS — G4709 Other insomnia: Secondary | ICD-10-CM

## 2023-08-31 ENCOUNTER — Encounter: Payer: Self-pay | Admitting: Nurse Practitioner

## 2023-08-31 DIAGNOSIS — F909 Attention-deficit hyperactivity disorder, unspecified type: Secondary | ICD-10-CM

## 2023-08-31 DIAGNOSIS — G4709 Other insomnia: Secondary | ICD-10-CM

## 2023-08-31 NOTE — Telephone Encounter (Unsigned)
 Copied from CRM [id number]. Topic: Clinical - Prescription Issue >> Aug 31, 2023  2:27 PM Freya Jesus wrote: Reason for CRM: Patient is calling frustrated that he has no sleeping pills ( triazolam  (HALCION ) 0.25 MG tablet) [facility] said that [facility] provider has to call them back and provide a code before they can process his prescription.

## 2023-08-31 NOTE — Telephone Encounter (Unsigned)
 Copied from CRM [id number]. Topic: Clinical - Prescription Issue >> Aug 31, 2023 10:53 AM Bambi Bonine D wrote: Reason for CRM: [facility] is calling to speak with Oley Berth, regarding the triazolam . [facility] stated that she is unable to refill the medication without letting the provider know about the risks with taking similar medications.

## 2023-09-01 ENCOUNTER — Telehealth: Payer: Self-pay

## 2023-09-01 NOTE — Telephone Encounter (Signed)
 LAST APPOINTMENT DATE: 08/30/2023 NEXT APPOINTMENT DATE: Visit date not found  triazolam  (HALCION ) 0.25 MG tablet 08/31/2023 30 tablet 1 ordered       Directions: TAKE 2 TABLETS (0.5 MG TOTAL) BY MOUTH AT BEDTIME AS NEEDED FOR SLEEP.(NOT COVERED BY INS)Authorized By: Dorothe Gaster, NP     Triazolam  needs a prior authorization. Pt is out of medication.

## 2023-09-01 NOTE — Telephone Encounter (Unsigned)
 Copied from CRM [id number]. Topic: Clinical - Prescription Issue >> Sep 01, 2023 11:20 AM Adonis Hoot wrote: Reason for CRM: [facility] called in [facility] that [facility] not [facility]  Triazolam  and adderall without the provider sending in some type of code that they are needing.He has called several different times for the last 2 days and nothing has been done. He stated that  he has not been able to sleep for the last 2 days without his sleeping medications. He is requesting a phone call to speak to provider or medical assistant.

## 2023-09-04 ENCOUNTER — Other Ambulatory Visit (HOSPITAL_COMMUNITY): Payer: Self-pay

## 2023-09-04 ENCOUNTER — Telehealth: Payer: Self-pay

## 2023-09-04 NOTE — Telephone Encounter (Signed)
 Duplicate message see one sent to provider.

## 2023-09-04 NOTE — Telephone Encounter (Signed)
 Copied from CRM [id number]. Topic: Clinical - Prescription Issue >> Sep 01, 2023 11:20 AM Logan Bruce wrote: Reason for CRM: [facility] called in stating that [facility] for  Triazolam  and adderall without the provider sending in some type of code that they are needing.He has called several different times for the last 2 days and nothing has been done. He stated that  he has not been able to sleep for the last 2 days without his sleeping medications. He is requesting Bruce phone call to speak to provider or medical assistant. >> Sep 01, 2023  6:17 PM Logan Bruce wrote: Patient is calling in regarding his medication, relayed that the message was sent to the prior authorization team. Patient would like this done asap on Monday due to not having his medication for over four days, and now has to go over the weekend as well >> Sep 01, 2023  3:16 PM Logan Bruce wrote: Patient called regarding Medication , in which he has left messages for return calls/messages. Agent called [facility] and was told to warm transfer

## 2023-09-04 NOTE — Telephone Encounter (Signed)
 It is ok to fill the triazolam . Patient has been on the medication for an extended period of time and tolerates it will with out adverse drug events   He is not on other medications that will reduce his respiration rate

## 2023-09-04 NOTE — Telephone Encounter (Signed)
 Patient calling on status, states provider needs to [facility] and provide [facility] to them it is there new policy has nothing to do with insurance. Patient states he has only 10 hours of sleep in last week without his medication.

## 2023-09-04 NOTE — Telephone Encounter (Signed)
 Called pharmacy information given by [facility] they will fill for patient. Called patient number on file left message to let them know has been called in.  No further action needed at this time.

## 2023-09-04 NOTE — Telephone Encounter (Signed)
 The Pharmacy will not fill without the provider approving. Due to low reparation rate when taking medication.  Even thought it has been at pharmacy and patient is aware Insurance will not cover.

## 2023-09-05 NOTE — Telephone Encounter (Signed)
 Per last note from myself I have called pharmacy and left message for patient to let know has been addressed. May want to call patient back and verify that he was abel to pick up

## 2023-09-05 NOTE — Telephone Encounter (Signed)
 Copied from CRM [id number]. Topic: Clinical - Prescription Issue >> Sep 01, 2023 11:20 AM [name] wrote: Reason for CRM: [facility] that [facility]  Tri[facility]am  and adderall without the provider sending in some type of code that they are needing.He has called several different times for the last 2 days and nothing has been done. He stated that  he has not been able to sleep for the last 2 days without his sleeping medications. He is requesting a phone call to speak to provider or medical assistant. >> Sep 04, 2023  5:08 PM [name] wrote: [facility] Please call [phone] pt when confirmed. >> Sep 01, 2023  6:17 PM [name] wrote: Patient is calling in regarding his medication, relayed that the message was sent to the prior authorization team. Patient would like this done asap on Monday due to not having his medication for over four days, and now has to go over the weekend as well >> Sep 01, 2023  3:16 PM [name] wrote: Patient called regarding Medication , in which he has left messages for return calls/messages. Agent called [facility] and was told to warm transfer

## 2023-09-05 NOTE — Telephone Encounter (Signed)
 Left detailed voicemail for patient to call the office back.

## 2023-09-06 MED ORDER — TRIAZOLAM 0.25 MG PO TABS: 0.5000 mg | ORAL_TABLET | Freq: Every day | ORAL | 2 refills | Status: DC

## 2023-09-06 NOTE — Telephone Encounter (Signed)
 Left voicemail for patient to call the office back.

## 2023-09-07 NOTE — Telephone Encounter (Signed)
 Pt responded on my chart.   Last read by Alm KATHEE Olene Arvella at 12:30PM on 09/06/2023.

## 2023-10-02 MED ORDER — AMPHETAMINE-DEXTROAMPHET ER 20 MG PO CP24: 20.0000 mg | ORAL_CAPSULE | ORAL | 0 refills | Status: DC

## 2023-10-02 NOTE — Telephone Encounter (Signed)
 LOV - 06/20/23 NOV - not scheduled RF - 06/20/23 #30/0 x 3

## 2023-10-02 NOTE — Addendum Note (Signed)
 Addended by: WENDEE LYNWOOD HERO on: 10/02/2023 04:46 PM   Modules accepted: Orders

## 2023-10-08 ENCOUNTER — Emergency Department

## 2023-10-08 ENCOUNTER — Other Ambulatory Visit: Payer: Self-pay

## 2023-10-08 ENCOUNTER — Emergency Department
Admission: EM | Admit: 2023-10-08 | Discharge: 2023-10-08 | Disposition: A | Discharge status: Home / Self Care | Attending: Emergency Medicine | Admitting: Emergency Medicine

## 2023-10-08 DIAGNOSIS — I10 Essential (primary) hypertension: Secondary | ICD-10-CM | POA: Diagnosis not present

## 2023-10-08 DIAGNOSIS — K5732 Diverticulitis of large intestine without perforation or abscess without bleeding: Secondary | ICD-10-CM | POA: Diagnosis not present

## 2023-10-08 DIAGNOSIS — K5792 Diverticulitis of intestine, part unspecified, without perforation or abscess without bleeding: Secondary | ICD-10-CM

## 2023-10-08 DIAGNOSIS — R1032 Left lower quadrant pain: Secondary | ICD-10-CM | POA: Diagnosis present

## 2023-10-08 LAB — COMPREHENSIVE METABOLIC PANEL WITH GFR
ALT: 32 U/L (ref 0–44)
AST: 31 U/L (ref 15–41)
Albumin: 3.8 g/dL (ref 3.5–5.0)
Alkaline Phosphatase: 76 U/L (ref 38–126)
Anion gap: 10 (ref 5–15)
BUN: 15 mg/dL (ref 6–20)
CO2: 24 mmol/L (ref 22–32)
Calcium: 9.6 mg/dL (ref 8.9–10.3)
Chloride: 101 mmol/L (ref 98–111)
Creatinine, Ser: 1.15 mg/dL (ref 0.61–1.24)
GFR, Estimated: >60 mL/min (ref >60)
Glucose, Bld: 121 mg/dL — ABNORMAL HIGH (ref 70–99)
Potassium: 3.6 mmol/L (ref 3.5–5.1)
Sodium: 135 mmol/L (ref 135–145)
Total Bilirubin: 0.6 mg/dL (ref 0.0–1.2)
Total Protein: 6.8 g/dL (ref 6.5–8.1)

## 2023-10-08 LAB — CBC
HCT: 44.4 % (ref 39.0–52.0)
Hemoglobin: 15.2 g/dL (ref 13.0–17.0)
MCH: 32.3 pg (ref 26.0–34.0)
MCHC: 34.2 g/dL (ref 30.0–36.0)
MCV: 94.5 fL (ref 80.0–100.0)
Platelets: 356 K/uL (ref 150–400)
RBC: 4.7 MIL/uL (ref 4.22–5.81)
RDW: 11.2 % — ABNORMAL LOW (ref 11.5–15.5)
WBC: 6.8 K/uL (ref 4.0–10.5)
nRBC: 0 % (ref 0.0–0.2)

## 2023-10-08 LAB — TROPONIN I (HIGH SENSITIVITY): Troponin I (High Sensitivity): 5 ng/L (ref <18)

## 2023-10-08 LAB — LIPASE, BLOOD: Lipase: 48 U/L (ref 11–51)

## 2023-10-08 MED ORDER — OXYCODONE-ACETAMINOPHEN 5-325 MG PO TABS: 1.0000 | ORAL_TABLET | ORAL | 0 refills | Status: AC | PRN

## 2023-10-08 MED ORDER — HYDROMORPHONE HCL 1 MG/ML IJ SOLN
1.0000 mg | Freq: Once | INTRAMUSCULAR | Status: AC
  Administered 2023-10-08: 1 mg via INTRAVENOUS
  Filled 2023-10-08: qty 1

## 2023-10-08 MED ORDER — SODIUM CHLORIDE 0.9 % IV BOLUS
1000.0000 mL | Freq: Once | INTRAVENOUS | Status: AC
  Administered 2023-10-08: 1000 mL via INTRAVENOUS

## 2023-10-08 MED ORDER — IOHEXOL 300 MG/ML  SOLN
100.0000 mL | Freq: Once | INTRAMUSCULAR | Status: AC | PRN
  Administered 2023-10-08: 100 mL via INTRAVENOUS

## 2023-10-08 MED ORDER — PIPERACILLIN-TAZOBACTAM 3.375 G IVPB 30 MIN
3.3750 g | Freq: Once | INTRAVENOUS | Status: AC
  Administered 2023-10-08: 3.375 g via INTRAVENOUS
  Filled 2023-10-08 (×2): qty 50

## 2023-10-08 MED ORDER — AMOXICILLIN-POT CLAVULANATE 875-125 MG PO TABS: 1.0000 | ORAL_TABLET | Freq: Two times a day (BID) | ORAL | 0 refills | Status: AC

## 2023-10-08 MED ORDER — ONDANSETRON HCL 4 MG/2ML IJ SOLN
4.0000 mg | Freq: Once | INTRAMUSCULAR | Status: AC
  Administered 2023-10-08: 4 mg via INTRAVENOUS
  Filled 2023-10-08: qty 2

## 2023-10-08 NOTE — Discharge Instructions (Addendum)
 Take the antibiotic as prescribed and finish the full course.  You may use the Percocet as needed for pain.  Follow-up with your primary care doctor.  Return to the ER for new, worsening, or persistent severe abdominal pain, vomiting, fever, or any other new or worsening symptoms that concern you.

## 2023-10-08 NOTE — ED Triage Notes (Signed)
 Patient ambulatory to triage with complaints of severe epigastric and LLQ abdominal pain. Patient states the pain is so bad he feels short of breath. Patient states this started earlier tonight and felt it was gas pain but could not sleep tonight so decided to come in.

## 2023-10-08 NOTE — ED Provider Notes (Signed)
 [facility] Provider Note    Event Date/Time   First MD Initiated Contact with Patient 10/08/23 0408     (approximate)   History   Abdominal Pain   HPI  Logan Bruce is a 58 y.o. male with a history of hypertension, GERD, and ADHD who presents with abdominal pain, acute onset around 12 hours ago, mainly in the left lower quadrant but also in the epigastric region.  He reports associated nausea but no vomiting.  He states that he initially thought he might be constipated so took a laxative and had a large bowel movement but this did not help the pain.  He denies any urinary symptoms.  He has no fever or chills.  He denies any prior history of this pain.  I reviewed the past medical records.  The patient's most recent outpatient visit was on 4/8 with family medicine for a physical and follow-up of his chronic conditions.   Physical Exam   Triage Vital Signs: ED Triage Vitals  Encounter Vitals Group     BP 10/08/23 0400 135/88     Girls Systolic BP Percentile --      Girls Diastolic BP Percentile --      Boys Systolic BP Percentile --      Boys Diastolic BP Percentile --      Pulse Rate 10/08/23 0400 73     Resp 10/08/23 0400 20     Temp 10/08/23 0400 97.7 F (36.5 C)     Temp Source 10/08/23 0400 Oral     SpO2 10/08/23 0400 99 %     Weight 10/08/23 0355 165 lb (74.8 kg)     Height 10/08/23 0355 5' 7 (1.702 m)     Head Circumference --      Peak Flow --      Pain Score 10/08/23 0355 10     Pain Loc --      Pain Education --      Exclude from Growth Chart --     Most recent vital signs: Vitals:   10/08/23 0400 10/08/23 0402  BP: 135/88   Pulse: 73   Resp: 20   Temp: 97.7 F (36.5 C)   SpO2: 99% 99%     General: Awake, no distress.  CV:  Good peripheral perfusion.  Resp:  Normal effort.  Abd:  Soft with moderate left lower quadrant tenderness.  No distention.  Other:  No jaundice or scleral icterus.   ED Results / Procedures /  Treatments   Labs (all labs ordered are listed, but only abnormal results are displayed) Labs Reviewed  COMPREHENSIVE METABOLIC PANEL WITH GFR - Abnormal; Notable for the following components:      Result Value   Glucose, Bld 121 (*)    All other components within normal limits  CBC - Abnormal; Notable for the following components:   RDW 11.2 (*)    All other components within normal limits  LIPASE, BLOOD  URINALYSIS, ROUTINE W REFLEX MICROSCOPIC  TROPONIN I (HIGH SENSITIVITY)     EKG  ED ECG REPORT I, Waylon Cassis, the attending physician, personally viewed and interpreted this ECG.  Date: 10/08/2023 EKG Time: 0357 Rate: 77 Rhythm: normal sinus rhythm QRS Axis: normal Intervals: Incomplete RBBB ST/T Wave abnormalities: normal Narrative Interpretation: no evidence of acute ischemia    RADIOLOGY  Chest x-ray: I independently viewed and interpreted the images; there is bronchial thickening with no focal consolidation or edema  CT abdomen/pelvis:  IMPRESSION:  1. Acute diverticulitis in the distal 7 cm of the descending colon,  with no free air or diverticular abscess.  2. Follow-up colonoscopy recommended after treatment to exclude  underlying lesion.  3. Aortic and age-advanced coronary artery atherosclerosis.  4. Mild hepatic steatosis.  5. Small umbilical and left lateral abdominal wall fat hernias.  6. Unilateral advanced right rectus atrophy below the umbilical  level, and asymmetric partial atrophy in the left abdominal oblique  musculature, seen previously.    Aortic Atherosclerosis (ICD10-I70.0).         PROCEDURES:  Critical Care performed: No  Procedures   MEDICATIONS ORDERED IN ED: Medications  piperacillin -tazobactam (ZOSYN ) IVPB 3.375 g (has no administration in time range)  sodium chloride  0.9 % bolus 1,000 mL (0 mLs Intravenous Stopped 10/08/23 0620)  ondansetron  (ZOFRAN ) injection 4 mg (4 mg Intravenous Given 10/08/23 0455)   HYDROmorphone  (DILAUDID ) injection 1 mg (1 mg Intravenous Given 10/08/23 0455)  iohexol  (OMNIPAQUE ) 300 MG/ML solution 100 mL (100 mLs Intravenous Contrast Given 10/08/23 0508)     IMPRESSION / MDM / ASSESSMENT AND PLAN / ED COURSE  I reviewed the triage vital signs and the nursing notes.  58 year old male with PMH as noted above presents with left lower quadrant and epigastric abdominal pain since yesterday afternoon.  On exam he is tender in the left lower quadrant.  Differential diagnosis includes, but is not limited to, reticulitis, colitis, SBO, volvulus, ureteral stone, pyelonephritis, musculoskeletal pain.  We will obtain lab workup, chest x-ray, CT abdomen/pelvis, give fluids and analgesia and reassess.  Patient's presentation is most consistent with acute presentation with potential threat to life or bodily function.  The patient is on the cardiac monitor to evaluate for evidence of arrhythmia and/or significant heart rate changes.   ----------------------------------------- 6:55 AM on 10/08/2023 -----------------------------------------  On reassessment, the patient's pain is well-controlled.  Lab workup is unremarkable.  CMP and CBC show no acute findings.  Troponin is negative.  Lipase is normal.  CT shows uncomplicated diverticulitis.  Given the reassuring labs, normal vital signs, and the fact that the patient has been tolerating p.o. he is appropriate for outpatient treatment.  I have ordered a dose of IV Zosyn  here to start treatment, and have prescribed Augmentin  as well as some analgesia for home.  I counseled the patient on the results of the workup and plan of care.  I gave strict return precautions, and he expressed understanding.   FINAL CLINICAL IMPRESSION(S) / ED DIAGNOSES   Final diagnoses:  Diverticulitis     Rx / DC Orders   ED Discharge Orders          Ordered    amoxicillin -clavulanate (AUGMENTIN ) 875-125 MG tablet  2 times daily        10/08/23 0653     oxyCODONE -acetaminophen  (PERCOCET) 5-325 MG tablet  Every 4 hours PRN        10/08/23 0653             Note:  This document was prepared using Dragon voice recognition software and may include unintentional dictation errors.    Jacolyn Pae, MD 10/08/23 [phone]

## 2023-10-12 ENCOUNTER — Telehealth: Payer: Self-pay

## 2023-10-12 NOTE — Transitions of Care (Post Inpatient/ED Visit) (Signed)
 Unable to reach patient by phone and left v/m requesting call back at [phone].      sonda  10/12/2023  Name: Logan Bruce MRN: [id number] DOB: 02/18/1966  Today's TOC FU Call Status: Today's TOC FU Call Status:: Unsuccessful Call (1st Attempt) Unsuccessful Call (1st Attempt) Date: 10/12/23  Attempted to reach the patient regarding the most recent Inpatient/ED visit.  Follow Up Plan: Additional outreach attempts will be made to reach the patient to complete the Transitions of Care (Post Inpatient/ED visit) call.   Signature  Laray Arenas, LPN

## 2023-10-16 NOTE — Transitions of Care (Post Inpatient/ED Visit) (Signed)
 Unable to reach patient by phone and left v/m requesting call back at [phone].        10/16/2023  Name: Logan Bruce MRN: [id number] DOB: [birth year]  Today's TOC FU Call Status: Today's TOC FU Call Status:: Unsuccessful Call (2nd Attempt) Unsuccessful Call (1st Attempt) Date: 10/12/23 Unsuccessful Call (2nd Attempt) Date: 10/16/23  Attempted to reach the patient regarding the most recent Inpatient/ED visit.  Follow Up Plan: Additional outreach attempts will be made to reach the patient to complete the Transitions of Care (Post Inpatient/ED visit) call.   Signature  Laray Arenas, LPN

## 2023-11-30 ENCOUNTER — Other Ambulatory Visit: Payer: Self-pay | Admitting: Nurse Practitioner

## 2023-11-30 DIAGNOSIS — F909 Attention-deficit hyperactivity disorder, unspecified type: Secondary | ICD-10-CM

## 2023-11-30 NOTE — Telephone Encounter (Unsigned)
 Copied from CRM [id number]. Topic: Clinical - Medication Refill >> Nov 30, 2023  1:00 PM Savanna F wrote: Medication: amphetamine -dextroamphetamine  (ADDERALL XR) 20 MG 24 hr capsule  Has the patient contacted their pharmacy? No, they tell him to call us . (Agent: If no, request that the patient contact the pharmacy for the refill. If patient does not wish to contact the pharmacy document the reason why and proceed with request.) (Agent: If yes, when and what did the pharmacy advise?)  This is the patient's preferred pharmacy:  [phone] [place] [facility] [address] [place] [address] Phone: [phone] Fax: [phone]  Is this the correct pharmacy for this prescription? Yes If no, delete pharmacy and type the correct one.   Has the prescription been filled recently? No  Is the patient out of the medication? No, but only has one left  Has the patient been seen for an appointment in the last year OR does the patient have an upcoming appointment? Yes  Can we respond through MyChart? Yes  Agent: Please be advised that Rx refills may take up to 3 business days. We ask that you follow-up with your pharmacy.

## 2023-12-01 NOTE — Telephone Encounter (Signed)
 Should have script on file at pharmacy. He needs an office visit before more refills are requested

## 2023-12-04 ENCOUNTER — Telehealth: Payer: Self-pay | Admitting: Nurse Practitioner

## 2023-12-04 DIAGNOSIS — F909 Attention-deficit hyperactivity disorder, unspecified type: Secondary | ICD-10-CM

## 2023-12-04 NOTE — Telephone Encounter (Signed)
 Copied from CRM [id number]. Topic: Clinical - Medication Refill >> Dec 04, 2023  1:37 PM Aleatha C wrote: Medication: amphetamine -dextroamphetamine  (ADDERALL XR) 20 MG 24 hr capsule  Has the patient contacted their pharmacy? Yes (Agent: If no, request that the patient contact the pharmacy for the refill. If patient does not wish to contact the pharmacy document the reason why and proceed with request.) (Agent: If yes, when and what did the pharmacy advise?)  This is the patient's preferred pharmacy:  [phone] [place] - [address] [place] [address] Phone: [phone] Fax: [phone]  Is this the correct pharmacy for this prescription? Yes If no, delete pharmacy and type the correct one.   Has the prescription been filled recently? No  Is the patient out of the medication? Yes  Has the patient been seen for an appointment in the last year OR does the patient have an upcoming appointment? Yes  Can we respond through MyChart? No  Agent: Please be advised that Rx refills may take up to 3 business days. We ask that you follow-up with your pharmacy.

## 2023-12-05 NOTE — Telephone Encounter (Unsigned)
 Copied from CRM #[id number]. Topic: Clinical - Medication Question >> Dec 05, 2023 11:32 AM Viola F wrote:  Patient called to follow up on refill for the amphetamine -dextroamphetamine  (ADDERALL XR) 20 MG 24 hr capsule - he scheduled his appt for 01/02/24 and would like the medication filled today or he said he will call back around 3pm requesting to speak with a supervisor. He is completely out of medication. He would like a message via MyChart letting him know when medication is sent.

## 2023-12-06 NOTE — Telephone Encounter (Signed)
 I denied the refill because he has a prescription on file. He needs to call the pharmacy before he calls here. He is more than welcome to talk to the supervisor. I just called the pharmacy and the medication is ready for pick up   Again he needs to contact the pharmacy first

## 2024-01-02 ENCOUNTER — Ambulatory Visit: Admitting: Nurse Practitioner

## 2024-01-02 VITALS — BP 130/88 | HR 82 | Temp 97.9°F | Ht 67.0 in | Wt 175.4 lb

## 2024-01-02 DIAGNOSIS — F909 Attention-deficit hyperactivity disorder, unspecified type: Secondary | ICD-10-CM

## 2024-01-02 DIAGNOSIS — H5711 Ocular pain, right eye: Secondary | ICD-10-CM | POA: Diagnosis not present

## 2024-01-02 MED ORDER — AMPHETAMINE-DEXTROAMPHET ER 20 MG PO CP24: 20.0000 mg | ORAL_CAPSULE | ORAL | 0 refills | Status: AC

## 2024-01-02 MED ORDER — ERYTHROMYCIN 5 MG/GM OP OINT: 1.0000 | TOPICAL_OINTMENT | Freq: Three times a day (TID) | OPHTHALMIC | 0 refills | Status: AC

## 2024-01-02 NOTE — Patient Instructions (Addendum)
 Nice to see you today  I have renewed the adderall prescriptions Use the eye ointment 3 times a day for the next 5-7 days Follow up with me in approx 6 months for you physical and labs

## 2024-01-02 NOTE — Progress Notes (Signed)
 Established Patient Office Visit  Subjective   Patient ID: Logan Bruce, male    DOB: May 08, 1965  Age: 58 y.o. MRN: [id number]  Chief Complaint  Patient presents with   Medication Refill    Adderall    Eye Problem    Pt complains of having wood in right eye.     HPI  Discussed the use of AI scribe software for clinical note transcription with the patient, who gave verbal consent to proceed.  History of Present Illness Logan Bruce is a 58 year old male who presents with a right eye injury after a foreign body incident.  While drilling a hole for a vent pipe yesterday at around 2 PM, debris flew into his right eye. He attempted to flush the [facility], which provided temporary relief. However, he woke up this morning with the eye crusted and matted, and he feels as though there is still something in the eye.  He describes the sensation as feeling like there is something in the outer corner of the eye. The eye has been watering, and there is clear discharge that crusted on his eyelashes overnight. No significant pain or vision changes, but there is irritation and a sensation of something being present in the eye.  He does not wear contact lenses and typically does not use protective eyewear. He denies any allergies to medications.  He is currently taking Adderall, which he finds effective in helping him stay focused and complete tasks, and triazolam , which he refilled recently. No trouble sleeping, palpitations, or heart-related symptoms. No significant pain in the eye, but notes irritation and a sensation of a foreign body. No vision changes aside from the watering and crusting.     Review of Systems  Constitutional:  Negative for chills and fever.  HENT:  Positive for ear discharge and ear pain.   Respiratory:  Negative for shortness of breath.   Cardiovascular:  Negative for chest pain and palpitations.  Neurological:  Negative for headaches.   Psychiatric/Behavioral:  The patient has insomnia.       Objective:     BP 130/88   Pulse 82   Temp 97.9 F (36.6 C) (Oral)   Ht 5' 7 (1.702 m)   Wt 175 lb 6.4 oz (79.6 kg)   SpO2 98%   BMI 27.47 kg/m  BP Readings from Last 3 Encounters:  01/02/24 130/88  10/08/23 118/78  06/20/23 130/88   Wt Readings from Last 3 Encounters:  01/02/24 175 lb 6.4 oz (79.6 kg)  10/08/23 165 lb (74.8 kg)  06/20/23 175 lb (79.4 kg)   SpO2 Readings from Last 3 Encounters:  01/02/24 98%  10/08/23 99%  06/20/23 94%      Physical Exam Vitals and nursing note reviewed.  Constitutional:      Appearance: Normal appearance.  Eyes:     Extraocular Movements: Extraocular movements intact.     Pupils: Pupils are equal, round, and reactive to light.     Comments: Woods light exam with fluorecin and tetracaine preformed.  No FB or overt corneal abrasion noted   Cardiovascular:     Rate and Rhythm: Normal rate and regular rhythm.     Heart sounds: Normal heart sounds.  Pulmonary:     Effort: Pulmonary effort is normal.     Breath sounds: Normal breath sounds.  Neurological:     Mental Status: He is alert.      No results found for any  visits on 01/02/24.    The 10-year ASCVD risk score (Arnett DK, et al., 2019) is: 7.1%    Assessment & Plan:   Problem List Items Addressed This Visit       Other   Attention deficit hyperactivity disorder (ADHD)   Relevant Medications   amphetamine -dextroamphetamine  (ADDERALL XR) 20 MG 24 hr capsule   amphetamine -dextroamphetamine  (ADDERALL XR) 20 MG 24 hr capsule   amphetamine -dextroamphetamine  (ADDERALL XR) 20 MG 24 hr capsule   Other Visit Diagnoses       Pain of right eye    -  Primary   Relevant Medications   erythromycin ophthalmic ointment     Assessment and Plan Assessment & Plan Corneal abrasion, right eye Confirmed corneal scratch with fluorescein exam. No foreign body or significant vision changes. - Prescribed  erythromycin eye ointment TID for one week. - Advised against Visine use during treatment. - Instructed to report vision changes or increased redness and pain. - Recommended protective glasses during high-risk activities.  Attention deficit hyperactivity disorder (ADHD) ADHD well-managed with current medication. Medication effective for focus and productivity. - Continue Adderall XR 20 mg daily. - Sent prescription for Adderall with three separate scripts for three months supply. - Continue triazolam    Return in about 6 months (around 07/02/2024) for CPE and Labs.    Logan Crandall, NP

## 2024-01-15 ENCOUNTER — Encounter: Payer: Self-pay | Admitting: Radiology

## 2024-01-25 ENCOUNTER — Other Ambulatory Visit: Payer: Self-pay | Admitting: Nurse Practitioner

## 2024-01-25 DIAGNOSIS — H5711 Ocular pain, right eye: Secondary | ICD-10-CM

## 2024-02-21 ENCOUNTER — Other Ambulatory Visit: Payer: Self-pay | Admitting: Nurse Practitioner

## 2024-02-21 DIAGNOSIS — G4709 Other insomnia: Secondary | ICD-10-CM

## 2024-06-26 ENCOUNTER — Encounter: Admitting: Nurse Practitioner
